# Patient Record
Sex: Female | Born: 1980 | Race: White | Hispanic: No | Marital: Single | Smoking: Former smoker
Health system: Southern US, Community
[De-identification: ages and names within clinical notes are randomized; demographics above are authoritative.]

## PROBLEM LIST (undated history)

## (undated) DIAGNOSIS — F319 Bipolar disorder, unspecified: Secondary | ICD-10-CM

## (undated) DIAGNOSIS — U071 COVID-19: Secondary | ICD-10-CM

## (undated) DIAGNOSIS — F329 Major depressive disorder, single episode, unspecified: Secondary | ICD-10-CM

## (undated) DIAGNOSIS — K5090 Crohn's disease, unspecified, without complications: Secondary | ICD-10-CM

## (undated) DIAGNOSIS — F32A Depression, unspecified: Secondary | ICD-10-CM

## (undated) DIAGNOSIS — F341 Dysthymic disorder: Secondary | ICD-10-CM

## (undated) DIAGNOSIS — F909 Attention-deficit hyperactivity disorder, unspecified type: Secondary | ICD-10-CM

## (undated) DIAGNOSIS — K589 Irritable bowel syndrome without diarrhea: Secondary | ICD-10-CM

## (undated) DIAGNOSIS — M26609 Unspecified temporomandibular joint disorder, unspecified side: Secondary | ICD-10-CM

## (undated) DIAGNOSIS — C4490 Unspecified malignant neoplasm of skin, unspecified: Secondary | ICD-10-CM

## (undated) DIAGNOSIS — R1310 Dysphagia, unspecified: Secondary | ICD-10-CM

## (undated) HISTORY — DX: Unspecified malignant neoplasm of skin, unspecified: C44.90

## (undated) HISTORY — DX: Depression, unspecified: F32.A

## (undated) HISTORY — DX: Crohn's disease, unspecified, without complications: K50.90

## (undated) HISTORY — DX: COVID-19: U07.1

## (undated) HISTORY — PX: APPENDECTOMY: SHX54

## (undated) HISTORY — DX: Dysthymic disorder: F34.1

## (undated) HISTORY — DX: Dysphagia, unspecified: R13.10

## (undated) HISTORY — DX: Bipolar disorder, unspecified: F31.9

## (undated) HISTORY — DX: Unspecified temporomandibular joint disorder, unspecified side: M26.609

## (undated) HISTORY — DX: Attention-deficit hyperactivity disorder, unspecified type: F90.9

## (undated) HISTORY — DX: Irritable bowel syndrome, unspecified: K58.9

## (undated) HISTORY — DX: Major depressive disorder, single episode, unspecified: F32.9

---

## 2004-05-20 HISTORY — PX: COLON SURGERY: SHX602

## 2008-05-14 ENCOUNTER — Emergency Department (HOSPITAL_COMMUNITY): Admission: EM | Admit: 2008-05-14 | Discharge: 2008-05-14 | Payer: Self-pay | Admitting: Emergency Medicine

## 2011-07-18 LAB — CBC
HCT: 41.2
MCV: 86.1
RBC: 4.78
WBC: 8.2

## 2011-07-18 LAB — COMPREHENSIVE METABOLIC PANEL
AST: 18
CO2: 25
Chloride: 108
Creatinine, Ser: 0.74
GFR calc Af Amer: >60
GFR calc non Af Amer: >60
Total Bilirubin: 0.6

## 2011-07-18 LAB — DIFFERENTIAL
Basophils Absolute: 0
Basophils Relative: 0
Eosinophils Absolute: 0.1
Eosinophils Relative: 1
Lymphocytes Relative: 32

## 2011-07-18 LAB — URINALYSIS, ROUTINE W REFLEX MICROSCOPIC
Ketones, ur: NEGATIVE
Nitrite: NEGATIVE
Protein, ur: NEGATIVE
Urobilinogen, UA: 0.2
pH: 6

## 2011-07-18 LAB — POCT PREGNANCY, URINE: Operator id: 29452

## 2012-10-20 HISTORY — PX: CHOLECYSTECTOMY: SHX55

## 2013-10-03 HISTORY — PX: ESOPHAGOGASTRODUODENOSCOPY: SHX1529

## 2016-07-27 DIAGNOSIS — R002 Palpitations: Secondary | ICD-10-CM

## 2016-07-27 HISTORY — DX: Palpitations: R00.2

## 2017-09-02 HISTORY — PX: COLONOSCOPY: SHX174

## 2019-05-24 ENCOUNTER — Telehealth: Payer: Self-pay

## 2019-05-24 NOTE — Telephone Encounter (Signed)
Called patient and lvm to return call in regards to her appointment and if she needs to be screened

## 2019-05-25 ENCOUNTER — Other Ambulatory Visit: Payer: Self-pay

## 2019-05-25 ENCOUNTER — Encounter: Payer: Self-pay | Admitting: Gastroenterology

## 2019-05-25 ENCOUNTER — Telehealth: Payer: Medicaid Other | Admitting: Gastroenterology

## 2019-05-25 ENCOUNTER — Telehealth (INDEPENDENT_AMBULATORY_CARE_PROVIDER_SITE_OTHER): Payer: Medicaid Other | Admitting: Gastroenterology

## 2019-05-25 VITALS — Ht 62.0 in | Wt 140.0 lb

## 2019-05-25 DIAGNOSIS — K5000 Crohn's disease of small intestine without complications: Secondary | ICD-10-CM

## 2019-05-25 NOTE — Progress Notes (Signed)
Chief Complaint:   Referring Provider:  Alanson Puls, [facility] *      ASSESSMENT AND PLAN;   #1.  Crohn's disease. Dx 2005 at age 38 s/p R ileocolectomy (2005), with postoperative recurrance, moderate activity on colon 07/2015. Refuses any meds for Crohn's d/t S/Es.  Self controlled on CBD oil.  Has associated IBS with diarrhea and element of postcholecystectomy diarrhea.   #2.  Associated B12 deficiency.  Plan: - Proceed with colonoscopy with MiraLAX. Discussed risks & benefits. (Risks including rare perforation req laparotomy, bleeding after biopsies/polypectomy req blood transfusion, rare chance of missing neoplasms, risks of anesthesia/sedation). Benefits outweigh the risks. Patient agrees to proceed. All the questions were answered.  - Please obtain previous records.  Had blood work performed this morning and previously. - B12 1 mg IM every month x 12 months. - Prior to colonoscopy, check stool for GI pathogens, WBCs and Giardia antigen (has well water).     HPI:    Monica Barker is a 38 y.o. female  For follow-up visit Recently had increased diarrhea from baseline (2-3/day) to 4/day.  Somewhat better now.  This is when she ran out of CBD oil.  Had few aphthous ulcers in the mouth which have resolved.  Also had some rectal bleeding which was attributed to "hemorrhoids".  These are better as well.  She has been trying to lose weight and has been able to lose 20 pounds.  Currently weighs 137 pounds.  No nausea, vomiting, significant abdominal pain, fever or chills.  Had skin rash which is resolved.  Refused all medications for Crohn's disease including steroids. As most of the medicines make her "crazy".   Past Medical History:  Diagnosis Date  . ADHD (attention deficit hyperactivity disorder)   . Bipolar disorder ([place])   . Crohn disease ([place])    Dx 2005 at age 22 s/p R hemicolectomy, with postoperative recurrance, moderate activity on colonoscopy 08/08/2015  .  Depressive disorder   . Dysphagia   . Dysthymic disorder   . IBS (irritable bowel syndrome)   . IBS (irritable bowel syndrome)    with diarrhea  . Skin cancer   . TMJ (temporomandibular joint disorder)     Past Surgical History:  Procedure Laterality Date  . APPENDECTOMY    . CHOLECYSTECTOMY  2014  . COLON SURGERY  05/2004   due to Crohns  . COLON SURGERY  05/2004  . COLONOSCOPY  09/02/2017   Recurrance of Crohn's disease in the neo terminal ileum (biopsied)- mild. Minimal rectal erythema (? importance-biopsed). No evidence of peranal Crohn's disease. Status post ileocolectomy.   . COLONOSCOPY  09/02/2017   Recurrance of Crohn's Disease in the neo terminal ileum (biopsed)-mild. Minimal rectal erythema (? Importance-biposed) No Evidence of peranal crohn's disease. Status post ileccolectomy.  . ESOPHAGOGASTRODUODENOSCOPY  10/03/2013   Mild gastritis. Normal EGD  . ESOPHAGOGASTRODUODENOSCOPY  10/03/2013   Mild Gastritis. Normal EGD    Family History  Problem Relation Age of Onset  . Skin cancer Father   . Colon cancer Paternal Aunt        Paternal mom's sister/PGM's sister  . Skin cancer Paternal Grandmother   . Breast cancer Maternal Grandmother        happened twice/x2  . Esophageal cancer Neg Hx     Social History   Tobacco Use  . Smoking status: Current Some Day Smoker  . Smokeless tobacco: Never Used  . Tobacco comment: Trying to quit  Substance Use Topics  . Alcohol  use: Not Currently  . Drug use: Not Currently    Current Outpatient Medications  Medication Sig Dispense Refill  . ALPRAZolam (XANAX XR) 0.5 MG 24 hr tablet Take 0.5 mg by mouth daily.    . AMBULATORY NON FORMULARY MEDICATION daily. CBD Oil    . Cyanocobalamin (VITAMIN B12 PO) Take 1 tablet by mouth daily.    Marland Kitchen lisdexamfetamine (VYVANSE) 30 MG capsule Take 30 mg by mouth daily.    . Multiple Vitamin (MULTIVITAMIN) capsule Take 1 capsule by mouth daily.    . Probiotic Product (PROBIOTIC PO) Take 1  tablet by mouth daily.    Marland Kitchen ALPRAZolam (XANAX) 0.5 MG tablet Take 0.5 mg by mouth 3 (three) times daily as needed for anxiety.    . AMBULATORY NON FORMULARY MEDICATION daily. CBD oil. Will see about the dosage she takes    . Cyanocobalamin (VITAMIN B-12 PO) Take 1 tablet by mouth daily.    Marland Kitchen lisdexamfetamine (VYVANSE) 30 MG capsule Take 30 mg by mouth daily.    . Multiple Vitamin (MULTIVITAMIN) tablet Take 1 tablet by mouth daily.    . Probiotic Product (PROBIOTIC PO) Take 1 tablet by mouth daily.     No current facility-administered medications for this visit.     Allergies  Allergen Reactions  . Latex   . Latex Other (See Comments)    unknown    Review of Systems:  Constitutional: Denies fever, chills, diaphoresis, appetite change and fatigue.  HEENT: Denies photophobia, eye pain, redness, hearing loss, ear pain, congestion, sore throat, rhinorrhea, sneezing, mouth sores, neck pain, neck stiffness and tinnitus.   Respiratory: Denies SOB, DOE, cough, chest tightness,  and wheezing.   Cardiovascular: Denies chest pain, palpitations and leg swelling.  Genitourinary: Denies dysuria, urgency, frequency, hematuria, flank pain and difficulty urinating.  Musculoskeletal: Denies myalgias, back pain, joint swelling, arthralgias and gait problem.  Skin: No rash.  Neurological: Denies dizziness, seizures, syncope, weakness, light-headedness, numbness and headaches.  Hematological: Denies adenopathy. Easy bruising, personal or family bleeding history  Psychiatric/Behavioral: has anxiety or depression     Physical Exam:    Ht 5' 2"  (1.575 m)   Wt 140 lb (63.5 kg)   BMI 25.61 kg/m  Filed Weights   05/25/19 1114  Weight: 140 lb (63.5 kg)   Constitutional:  Well-developed, in no acute distress. Psychiatric: Normal mood and affect. Behavior is normal. HEENT: Pupils normal.  Conjunctivae are normal. No scleral icterus. tele  Data Reviewed: I have personally reviewed following labs and  imaging studies  CBC: CBC 05/14/2008  WBC 8.2  Hemoglobin 13.9  Hematocrit 41.2  Platelets 254    CMP: CMP 05/14/2008  Glucose 103(H)  BUN 6  Creatinine 0.74  Sodium 137  Potassium 3.8  Chloride 108  CO2 25  Calcium 9.1  Total Protein 6.6  Total Bilirubin 0.6  Alkaline Phos 45  AST 18  ALT 15    This service was provided via video doxy telemedicine.  The patient was located at home.  The provider was located in office.  The patient did consent to this telephone visit and is aware of possible charges through their insurance for this visit.  The patient was referred by self.   Time spent on call/coordination of care: 25 min    Carmell Austria, MD 05/25/2019, 6:09 PM  Cc: Alanson Puls, [facility] *

## 2019-05-26 MED ORDER — CYANOCOBALAMIN 1000 MCG/ML IJ SOLN: 1000.0000 ug | INTRAMUSCULAR | 12 refills | Status: DC

## 2019-05-26 NOTE — Patient Instructions (Addendum)
If you are age 36 or older, your body mass index should be between 23-30. Your Body mass index is 25.61 kg/m. If this is out of the aforementioned range listed, please consider follow up with your Primary Care Provider.  If you are age 26 or younger, your body mass index should be between 19-25. Your Body mass index is 25.61 kg/m. If this is out of the aformentioned range listed, please consider follow up with your Primary Care Provider.   We have sent the following medications to your pharmacy for you to pick up at your convenience: Vitamin B12   You have been scheduled for a colonoscopy. Please follow written instructions given to you at your visit today.  Please pick up your prep supplies at the pharmacy within the next 1-3 days. If you use inhalers (even only as needed), please bring them with you on the day of your procedure. Your physician has requested that you go to www.[email or web address].com and enter the access code given to you at your visit today. This web site gives a general overview about your procedure. However, you should still follow specific instructions given to you by our office regarding your preparation for the procedure.  Please go to the lab at [facility] ([place] You will need to go to level "B", you do not need an appointment for this. Hours available are 7:30 am - 4:30 pm.   I have attached a Pre Procedure Patient Acknowledgement form and a prepaid envelope, please initial and sign form and mail back in envelope.    Thank you,  Dr. Jackquline Denmark

## 2019-05-26 NOTE — Addendum Note (Signed)
Addended by: Karena Addison on: 05/26/2019 02:21 PM   Modules accepted: Orders

## 2019-06-13 ENCOUNTER — Telehealth: Payer: Self-pay | Admitting: Gastroenterology

## 2019-06-13 ENCOUNTER — Telehealth: Payer: Self-pay

## 2019-06-13 NOTE — Telephone Encounter (Signed)
Covid-19 screening questions   Do you now or have you had a fever in the last 14 days? NO   Do you have any respiratory symptoms of shortness of breath or cough now or in the last 14 days? NO   Do you have any family members or close contacts with diagnosed or suspected Covid-19 in the past 14 days? NO   Have you been tested for Covid-19 and found to be positive? NO

## 2019-06-13 NOTE — Telephone Encounter (Signed)
Noted  

## 2019-06-13 NOTE — Telephone Encounter (Signed)
Returned patients call. No answer. A message was left that I would call her later today.   Riki Sheer, LPN ( [facility] )

## 2019-06-14 ENCOUNTER — Encounter: Payer: Medicaid Other | Admitting: Gastroenterology

## 2019-07-15 ENCOUNTER — Encounter: Payer: Medicaid Other | Admitting: Gastroenterology

## 2019-08-11 ENCOUNTER — Telehealth: Payer: Self-pay

## 2019-08-11 NOTE — Telephone Encounter (Signed)
Covid-19 screening questions   Do you now or have you had a fever in the last 14 days?  Do you have any respiratory symptoms of shortness of breath or cough now or in the last 14 days?  Do you have any family members or close contacts with diagnosed or suspected Covid-19 in the past 14 days?  Have you been tested for Covid-19 and found to be positive?       

## 2019-08-12 ENCOUNTER — Encounter: Payer: Medicaid Other | Admitting: Gastroenterology

## 2019-12-08 ENCOUNTER — Telehealth: Payer: Medicaid Other | Admitting: Gastroenterology

## 2019-12-22 ENCOUNTER — Other Ambulatory Visit: Payer: Self-pay

## 2019-12-22 ENCOUNTER — Encounter: Payer: Self-pay | Admitting: Gastroenterology

## 2019-12-22 ENCOUNTER — Telehealth (INDEPENDENT_AMBULATORY_CARE_PROVIDER_SITE_OTHER): Payer: Medicaid Other | Admitting: Gastroenterology

## 2019-12-22 VITALS — Ht 62.0 in | Wt 134.0 lb

## 2019-12-22 DIAGNOSIS — K625 Hemorrhage of anus and rectum: Secondary | ICD-10-CM

## 2019-12-22 DIAGNOSIS — E538 Deficiency of other specified B group vitamins: Secondary | ICD-10-CM | POA: Diagnosis not present

## 2019-12-22 DIAGNOSIS — K50919 Crohn's disease, unspecified, with unspecified complications: Secondary | ICD-10-CM

## 2019-12-22 DIAGNOSIS — Z8616 Personal history of COVID-19: Secondary | ICD-10-CM

## 2019-12-22 MED ORDER — PREDNISONE 10 MG PO TABS: ORAL_TABLET | ORAL | 0 refills | Status: DC

## 2019-12-22 NOTE — Progress Notes (Signed)
Chief Complaint:   Referring Provider:  Alanson Puls, [facility] *      ASSESSMENT AND PLAN;   #1.  Crohn's disease with exacerbation likely due to recent COVID-66.   Dx 2005 at age 39 s/p R ileocolectomy (2005), with postoperative recurrance, moderate activity on colon 07/2015. Refused any meds for Crohn's d/t S/Es.  Self controlled on CBD oil.  Has associated IBS with diarrhea and element of postcholecystectomy diarrhea.   #2.  Associated B12 deficiency.  Plan: - Stool for GI pathogen, C. Diff, fecal calprotectin and giardia antigen. - Check CBC, CMP, CRP, B12, TB Gold and HBsAg, anti HBsAb, HBcAb IgG. - CT A/P with p.o. and IV contrast ASAP. - Start prednisone 40 mg p.o. QD x 1 week, 30 mg p.o. Qd x 1 week, then, 20 mg qd x 2 weeks, then 10 mg QD x 2 weeks and then stop. D/w pt S/Es of prednisone including weight gain, diabetes, osteoporosis, cataracts, glaucoma and psychiatric manifestations. - Recommend colonoscopy with MiraLAX in 6-8 weeks. - Avoid NSAIDs. - Hold off on work. Needs note off work. Can e mail note [email or web address] - FU next week (can do tele) - Discussed regarding Humira.     HPI:    Monica Barker is a 39 y.o. female  Had covid Feb 2- whole family.  Unfortunately, GM passed away  Thereafter, she started having increasing lower abdominal pain, diarrhea with watery bowel movements 5-10/day, most recently started having blood mixed with the stool.  No fever but having chills.  Able to eat and drink  Lost 2-3lb since February.  She was previously scheduled for colonoscopy but she canceled.  Refused all medications for Crohn's disease in the past. As most of the medicines make her "crazy".   Past Medical History:  Diagnosis Date  . ADHD (attention deficit hyperactivity disorder)   . Bipolar disorder ([place])   . Crohn disease ([place])    Dx 2005 at age 37 s/p R hemicolectomy, with postoperative recurrance, moderate activity on colonoscopy 08/08/2015    . Depressive disorder   . Dysphagia   . Dysthymic disorder   . IBS (irritable bowel syndrome)   . IBS (irritable bowel syndrome)    with diarrhea  . Skin cancer   . TMJ (temporomandibular joint disorder)     Past Surgical History:  Procedure Laterality Date  . APPENDECTOMY    . CHOLECYSTECTOMY  2014  . COLON SURGERY  05/2004   due to Crohns  . COLON SURGERY  05/2004  . COLONOSCOPY  09/02/2017   Recurrance of Crohn's disease in the neo terminal ileum (biopsied)- mild. Minimal rectal erythema (? importance-biopsed). No evidence of peranal Crohn's disease. Status post ileocolectomy.   . COLONOSCOPY  09/02/2017   Recurrance of Crohn's Disease in the neo terminal ileum (biopsed)-mild. Minimal rectal erythema (? Importance-biposed) No Evidence of peranal crohn's disease. Status post ileccolectomy.  . ESOPHAGOGASTRODUODENOSCOPY  10/03/2013   Mild gastritis. Normal EGD  . ESOPHAGOGASTRODUODENOSCOPY  10/03/2013   Mild Gastritis. Normal EGD    Family History  Problem Relation Age of Onset  . Skin cancer Father   . Colon cancer Paternal Aunt        Paternal mom's sister/PGM's sister  . Skin cancer Paternal Grandmother   . Breast cancer Maternal Grandmother        happened twice/x2  . Esophageal cancer Neg Hx     Social History   Tobacco Use  . Smoking status: Former Research scientist (life sciences)  . Smokeless tobacco: Never  Used  . Tobacco comment: 09/2019  Substance Use Topics  . Alcohol use: Not Currently  . Drug use: Not Currently    Current Outpatient Medications  Medication Sig Dispense Refill  . ALPRAZolam (XANAX) 0.5 MG tablet Take 0.5 mg by mouth 3 (three) times daily as needed for anxiety.    . AMBULATORY NON FORMULARY MEDICATION daily. CBD Oil    . cyanocobalamin (,VITAMIN B-12,) 1000 MCG/ML injection Inject 1 mL (1,000 mcg total) into the muscle every 30 (thirty) days. 1 mL 12  . lisdexamfetamine (VYVANSE) 30 MG capsule Take 30 mg by mouth daily.    . Multiple Vitamin (MULTIVITAMIN)  capsule Take 1 capsule by mouth daily.    . Probiotic Product (PROBIOTIC PO) Take 1 tablet by mouth daily.     No current facility-administered medications for this visit.    Allergies  Allergen Reactions  . Ativan [Lorazepam]     intesitided her anxiety   . Latex   . Latex Other (See Comments)    unknown    Review of Systems:  neg     Physical Exam:    Ht 5' 2"  (1.575 m)   Wt 134 lb (60.8 kg)   BMI 24.51 kg/m  Filed Weights   12/22/19 1317  Weight: 134 lb (60.8 kg)   tele  Data Reviewed: I have personally reviewed following labs and imaging studies  CBC: CBC 05/14/2008  WBC 8.2  Hemoglobin 13.9  Hematocrit 41.2  Platelets 254    CMP: CMP 05/14/2008  Glucose 103(H)  BUN 6  Creatinine 0.74  Sodium 137  Potassium 3.8  Chloride 108  CO2 25  Calcium 9.1  Total Protein 6.6  Total Bilirubin 0.6  Alkaline Phos 45  AST 18  ALT 15    This service was provided via video doxy telemedicine.  The patient was located at home.  The provider was located in office.  The patient did consent to this telephone visit and is aware of possible charges through their insurance for this visit.  The patient was referred by self.   Time spent on call/coordination of care: 25 min    Carmell Austria, MD 12/22/2019, 4:02 PM  Cc: Alanson Puls, [facility] *

## 2019-12-22 NOTE — Patient Instructions (Signed)
If you are age 37 or older, your body mass index should be between 23-30. Your Body mass index is 24.51 kg/m. If this is out of the aforementioned range listed, please consider follow up with your Primary Care Provider.  If you are age 66 or younger, your body mass index should be between 19-25. Your Body mass index is 24.51 kg/m. If this is out of the aformentioned range listed, please consider follow up with your Primary Care Provider.   Please go to the lab at [facility] ([place] You will need to go to level "B", you do not need an appointment for this. Hours available are 7:30 am - 4:30 pm.   [facility] [address] [facility]   You are scheduled on 12/27/19 at [employer] should arrive 15 minutes prior to your appointment time for registration. Please follow the written instructions below on the day of your exam:  WARNING: IF YOU ARE ALLERGIC TO IODINE/X-RAY DYE, PLEASE NOTIFY RADIOLOGY IMMEDIATELY AT [phone]! YOU WILL BE GIVEN A 13 HOUR PREMEDICATION PREP.  1) Do not eat or drink anything after 5am (4 hours prior to your test) 2) You have been given 2 bottles of oral contrast to drink. The solution may taste better if refrigerated, but do NOT add ice or any other liquid to this solution. Shake well before drinking.    Drink 1 bottle of contrast @ 7am (2 hours prior to your exam)  Drink 1 bottle of contrast @ 8am (1 hour prior to your exam)  You may take any medications as prescribed with a small amount of water, if necessary. If you take any of the following medications: METFORMIN, GLUCOPHAGE, GLUCOVANCE, AVANDAMET, RIOMET, FORTAMET, [place] MET, JANUMET, GLUMETZA or METAGLIP, you MAY be asked to HOLD this medication 48 hours AFTER the exam.  The purpose of you drinking the oral contrast is to aid in the visualization of your intestinal  tract. The contrast solution may cause some diarrhea. Depending on your individual set of symptoms, you may also receive an intravenous injection of x-ray contrast/dye. Plan on being at [facility] for 30 minutes or longer, depending on the type of exam you are having performed.  This test typically takes 30-45 minutes to complete.  If you have any questions regarding your exam or if you need to reschedule, you may call the CT department at [phone] between the hours of 8:00 am and 5:00 pm, Monday-Friday.  ________________________________________________________________________  Avoid NSAIDS.   We have sent the following medications to your pharmacy for you to pick up at your convenience: Prednisone   Follow up in 6-8 weeks.   Thank you,  Dr. Jackquline Denmark

## 2019-12-23 ENCOUNTER — Telehealth: Payer: Self-pay | Admitting: Gastroenterology

## 2019-12-23 NOTE — Telephone Encounter (Signed)
Note has been emailed to the patient at the address listed in previous message;

## 2019-12-23 NOTE — Telephone Encounter (Signed)
Off work x 2 weeks while Crohn's work-up is in progress Please email her the note at [email or web address]  Thx  RG

## 2019-12-23 NOTE — Telephone Encounter (Signed)
Please review patient message and advise

## 2019-12-27 ENCOUNTER — Other Ambulatory Visit (INDEPENDENT_AMBULATORY_CARE_PROVIDER_SITE_OTHER): Payer: BC Managed Care – PPO

## 2019-12-27 ENCOUNTER — Ambulatory Visit (HOSPITAL_BASED_OUTPATIENT_CLINIC_OR_DEPARTMENT_OTHER): Payer: BC Managed Care – PPO

## 2019-12-27 DIAGNOSIS — K50919 Crohn's disease, unspecified, with unspecified complications: Secondary | ICD-10-CM

## 2019-12-27 DIAGNOSIS — K625 Hemorrhage of anus and rectum: Secondary | ICD-10-CM

## 2019-12-27 LAB — COMPREHENSIVE METABOLIC PANEL
ALT: 10 U/L (ref 0–35)
AST: 13 U/L (ref 0–37)
Albumin: 4.2 g/dL (ref 3.5–5.2)
Alkaline Phosphatase: 45 U/L (ref 39–117)
BUN: 6 mg/dL (ref 6–23)
CO2: 26 mEq/L (ref 19–32)
Calcium: 9.4 mg/dL (ref 8.4–10.5)
Chloride: 106 mEq/L (ref 96–112)
Creatinine, Ser: 0.73 mg/dL (ref 0.40–1.20)
GFR: 89.9 mL/min (ref >60.00)
Glucose, Bld: 84 mg/dL (ref 70–99)
Potassium: 3.9 mEq/L (ref 3.5–5.1)
Sodium: 137 mEq/L (ref 135–145)
Total Bilirubin: 0.5 mg/dL (ref 0.2–1.2)
Total Protein: 7.3 g/dL (ref 6.0–8.3)

## 2019-12-27 LAB — CBC WITH DIFFERENTIAL/PLATELET
Basophils Absolute: 0 10*3/uL (ref 0.0–0.1)
Basophils Relative: 0.5 % (ref 0.0–3.0)
Eosinophils Absolute: 0 10*3/uL (ref 0.0–0.7)
Eosinophils Relative: 0.3 % (ref 0.0–5.0)
HCT: 40.2 % (ref 36.0–46.0)
Hemoglobin: 13.4 g/dL (ref 12.0–15.0)
Lymphocytes Relative: 26.4 % (ref 12.0–46.0)
Lymphs Abs: 1.6 10*3/uL (ref 0.7–4.0)
MCHC: 33.3 g/dL (ref 30.0–36.0)
MCV: 89 fl (ref 78.0–100.0)
Monocytes Absolute: 0.3 10*3/uL (ref 0.1–1.0)
Monocytes Relative: 5.5 % (ref 3.0–12.0)
Neutro Abs: 4.1 10*3/uL (ref 1.4–7.7)
Neutrophils Relative %: 67.3 % (ref 43.0–77.0)
Platelets: 272 10*3/uL (ref 150.0–400.0)
RBC: 4.52 Mil/uL (ref 3.87–5.11)
RDW: 12.8 % (ref 11.5–15.5)
WBC: 6.1 10*3/uL (ref 4.0–10.5)

## 2019-12-27 LAB — C-REACTIVE PROTEIN: CRP: <1 mg/dL (ref 0.5–20.0)

## 2019-12-27 LAB — VITAMIN B12: Vitamin B-12: 416 pg/mL (ref 211–911)

## 2019-12-28 ENCOUNTER — Encounter (HOSPITAL_BASED_OUTPATIENT_CLINIC_OR_DEPARTMENT_OTHER): Payer: Self-pay

## 2019-12-28 ENCOUNTER — Other Ambulatory Visit: Payer: Self-pay

## 2019-12-28 ENCOUNTER — Ambulatory Visit (HOSPITAL_BASED_OUTPATIENT_CLINIC_OR_DEPARTMENT_OTHER)
Admission: RE | Admit: 2019-12-28 | Discharge: 2019-12-28 | Disposition: A | Discharge status: Home / Self Care | Payer: BC Managed Care – PPO | Source: Ambulatory Visit | Attending: Gastroenterology | Admitting: Gastroenterology

## 2019-12-28 ENCOUNTER — Encounter: Payer: Self-pay | Admitting: Gastroenterology

## 2019-12-28 ENCOUNTER — Telehealth (INDEPENDENT_AMBULATORY_CARE_PROVIDER_SITE_OTHER): Payer: BC Managed Care – PPO | Admitting: Gastroenterology

## 2019-12-28 VITALS — Ht 62.0 in | Wt 136.0 lb

## 2019-12-28 DIAGNOSIS — K50919 Crohn's disease, unspecified, with unspecified complications: Secondary | ICD-10-CM | POA: Diagnosis not present

## 2019-12-28 DIAGNOSIS — K625 Hemorrhage of anus and rectum: Secondary | ICD-10-CM | POA: Diagnosis present

## 2019-12-28 MED ORDER — IOHEXOL 300 MG/ML  SOLN
100.0000 mL | Freq: Once | INTRAMUSCULAR | Status: AC | PRN
  Administered 2019-12-28: 100 mL via INTRAVENOUS

## 2019-12-28 NOTE — Addendum Note (Signed)
Addended by: Karena Addison on: 12/28/2019 09:45 AM   Modules accepted: Orders

## 2019-12-28 NOTE — Patient Instructions (Signed)
If you are age 37 or older, your body mass index should be between 23-30. Your Body mass index is 24.87 kg/m. If this is out of the aforementioned range listed, please consider follow up with your Primary Care Provider.  If you are age 27 or younger, your body mass index should be between 19-25. Your Body mass index is 24.87 kg/m. If this is out of the aformentioned range listed, please consider follow up with your Primary Care Provider.   You have been scheduled for a colonoscopy. Please follow written instructions given to you at your visit today.  Please pick up your prep supplies at the pharmacy within the next 1-3 days. If you use inhalers (even only as needed), please bring them with you on the day of your procedure. Your physician has requested that you go to www.[email or web address] and enter the access code given to you at your visit today. This web site gives a general overview about your procedure. However, you should still follow specific instructions given to you by our office regarding your preparation for the procedure.  Thank you,  Dr. Jackquline Denmark

## 2019-12-28 NOTE — Progress Notes (Signed)
Chief Complaint: FU  Referring Provider:  Alanson Puls, [facility] *      ASSESSMENT AND PLAN;   #1.  Crohn's disease with exacerbation likely due to recent COVID-26.   Dx 2005 at age 39 s/p R ileocolectomy (2005), with postoperative recurrance, moderate activity on colon 07/2015. Refused any meds for Crohn's d/t S/Es.  Self controlled on CBD oil.  Has associated IBS with diarrhea and element of postcholecystectomy diarrhea.   #2.  Associated B12 deficiency. Last B12 was normal.  Plan: - Stool for GI pathogen, C. Diff, fecal calprotectin and giardia antigen. - Follow results of TB Gold and HBsAg, anti HBsAb, HBcAb IgG (pending) - CT A/P with p.o. and IV contrast ASAP.  She is on her way to get it done. - She has reduced prednisone to 30 mg p.o. Qd x 1 week, then, 20 mg qd x 2 weeks, then 10 mg QD x 2 weeks and then stop. D/w pt S/Es of prednisone including weight gain, diabetes, osteoporosis, cataracts, glaucoma and psychiatric manifestations. - Recommend colonoscopy with MiraLAX in 2-3 weeks at [facility]. - Avoid NSAIDs. - Discussed regarding Humira again.     HPI:    Monica Barker is a 39 y.o. female  Had covid Feb 2- whole family.  Unfortunately, GM passed away  Thereafter, she started having increasing lower abdominal pain, diarrhea with watery bowel movements 5-10/day, most recently started having blood mixed with the stool.  No fever but having chills.  Able to eat and drink  Lost 2-3lb since February.  She was previously scheduled for colonoscopy but she canceled.  Refused all medications for Crohn's disease in the past. As most of the medicines make her "crazy".   Started on prednisone last week.  She feels significantly better.  Her CBC, CMP, B12, C-reactive protein was normal.  Unfortunately the lab did not give her stool container.  She is on her way to get CT done today.  Would like to get colonoscopy as well.   Past Medical History:  Diagnosis Date  .  ADHD (attention deficit hyperactivity disorder)   . Bipolar disorder ([place])   . Crohn disease ([place])    Dx 2005 at age 88 s/p R hemicolectomy, with postoperative recurrance, moderate activity on colonoscopy 08/08/2015  . Depressive disorder   . Dysphagia   . Dysthymic disorder   . IBS (irritable bowel syndrome)   . IBS (irritable bowel syndrome)    with diarrhea  . Skin cancer   . TMJ (temporomandibular joint disorder)     Past Surgical History:  Procedure Laterality Date  . APPENDECTOMY    . CHOLECYSTECTOMY  2014  . COLON SURGERY  05/2004   due to Crohns  . COLON SURGERY  05/2004  . COLONOSCOPY  09/02/2017   Recurrance of Crohn's disease in the neo terminal ileum (biopsied)- mild. Minimal rectal erythema (? importance-biopsed). No evidence of peranal Crohn's disease. Status post ileocolectomy.   . COLONOSCOPY  09/02/2017   Recurrance of Crohn's Disease in the neo terminal ileum (biopsed)-mild. Minimal rectal erythema (? Importance-biposed) No Evidence of peranal crohn's disease. Status post ileccolectomy.  . ESOPHAGOGASTRODUODENOSCOPY  10/03/2013   Mild gastritis. Normal EGD  . ESOPHAGOGASTRODUODENOSCOPY  10/03/2013   Mild Gastritis. Normal EGD    Family History  Problem Relation Age of Onset  . Skin cancer Father   . Colon cancer Paternal Aunt        Paternal mom's sister/PGM's sister  . Skin cancer Paternal Grandmother   .  Breast cancer Maternal Grandmother        happened twice/x2  . Esophageal cancer Neg Hx     Social History   Tobacco Use  . Smoking status: Former Research scientist (life sciences)  . Smokeless tobacco: Never Used  . Tobacco comment: 09/2019  Substance Use Topics  . Alcohol use: Not Currently  . Drug use: Not Currently    Current Outpatient Medications  Medication Sig Dispense Refill  . ALPRAZolam (XANAX) 0.5 MG tablet Take 0.5 mg by mouth 3 (three) times daily as needed for anxiety.    . AMBULATORY NON FORMULARY MEDICATION daily. CBD Oil    . cyanocobalamin  (,VITAMIN B-12,) 1000 MCG/ML injection Inject 1 mL (1,000 mcg total) into the muscle every 30 (thirty) days. 1 mL 12  . lisdexamfetamine (VYVANSE) 30 MG capsule Take 30 mg by mouth daily.    . Multiple Vitamin (MULTIVITAMIN) capsule Take 1 capsule by mouth daily.    . predniSONE (DELTASONE) 10 MG tablet We have sent the following medications to your pharmacy for you to pick up at your convenience: Prednisone 32m tablet 473mby mouth x 1 week. 3050my mouth x 1 week. 67m24m mouth x 2 weeks. 10mg63mmouth x 2 weeks. 100 tablet 0  . Probiotic Product (PROBIOTIC PO) Take 1 tablet by mouth daily.     No current facility-administered medications for this visit.    Allergies  Allergen Reactions  . Ativan [Lorazepam]     intesitided her anxiety   . Latex   . Latex Other (See Comments)    unknown    Review of Systems:  neg     Physical Exam:    Ht 5' 2"  (1.575 m)   Wt 136 lb (61.7 kg)   BMI 24.87 kg/m  Filed Weights   12/28/19 0836  Weight: 136 lb (61.7 kg)   tele  Data Reviewed: I have personally reviewed following labs and imaging studies  CBC: CBC Latest Ref Rng & Units 12/27/2019 05/14/2008  WBC 4.0 - 10.5 K/uL 6.1 8.2  Hemoglobin 12.0 - 15.0 g/dL 13.4 13.9  Hematocrit 36.0 - 46.0 % 40.2 41.2  Platelets 150.0 - 400.0 K/uL 272.0 254    CMP: CMP Latest Ref Rng & Units 12/27/2019 05/14/2008  Glucose 70 - 99 mg/dL 84 103(H)  BUN 6 - 23 mg/dL 6 6  Creatinine 0.40 - 1.20 mg/dL 0.73 0.74  Sodium 135 - 145 mEq/L 137 137  Potassium 3.5 - 5.1 mEq/L 3.9 3.8  Chloride 96 - 112 mEq/L 106 108  CO2 19 - 32 mEq/L 26 25  Calcium 8.4 - 10.5 mg/dL 9.4 9.1  Total Protein 6.0 - 8.3 g/dL 7.3 6.6  Total Bilirubin 0.2 - 1.2 mg/dL 0.5 0.6  Alkaline Phos 39 - 117 U/L 45 45  AST 0 - 37 U/L 13 18  ALT 0 - 35 U/L 10 15    This service was provided via video doxy telemedicine.  The patient was located at home.  The provider was located in office.  The patient did consent to this telephone  visit and is aware of possible charges through their insurance for this visit.  The patient was referred by self.   Time spent on call/coordination of care: 15 min    Raj GCarmell Austria3/07/2020, 8:55 AM  Cc: Pllc,Alanson [place]uls[place] *

## 2019-12-29 LAB — QUANTIFERON-TB GOLD PLUS
Mitogen-NIL: >10 IU/mL
NIL: 0.07 IU/mL
QuantiFERON-TB Gold Plus: NEGATIVE
TB1-NIL: 0.02 IU/mL
TB2-NIL: 0.05 IU/mL

## 2019-12-29 LAB — HEPATITIS B CORE ANTIBODY, IGM: Hep B C IgM: NONREACTIVE

## 2019-12-29 LAB — HEPATITIS B SURFACE ANTIBODY,QUALITATIVE: Hep B S Ab: NONREACTIVE

## 2019-12-29 LAB — HEPATITIS B SURFACE ANTIGEN: Hepatitis B Surface Ag: NONREACTIVE

## 2020-01-01 NOTE — Progress Notes (Signed)
CT reviewed. Neg TB and HBsAg Start Humira Day 1 148m, day 15 847m then 4091mQ everyother week starting day 29. Needs pneumococcal and hep B vaccine (can be done any time). Colon as per last note. RG

## 2020-01-02 ENCOUNTER — Other Ambulatory Visit: Payer: Self-pay

## 2020-01-02 MED ORDER — HUMIRA-CD/UC/HS STARTER 80 MG/0.8ML ~~LOC~~ AJKT: 80.0000 mg | AUTO-INJECTOR | SUBCUTANEOUS | 0 refills | Status: DC

## 2020-01-02 MED ORDER — HUMIRA (2 PEN) 40 MG/0.4ML ~~LOC~~ AJKT: 40.0000 mg | AUTO-INJECTOR | SUBCUTANEOUS | 11 refills | Status: DC

## 2020-01-19 ENCOUNTER — Encounter: Payer: BC Managed Care – PPO | Admitting: Gastroenterology

## 2020-02-01 ENCOUNTER — Telehealth: Payer: Self-pay | Admitting: Gastroenterology

## 2020-02-01 NOTE — Telephone Encounter (Signed)
[employer] would like [place] to provide pt w/their direct number if she was to call office for any other reason. The number is [phone].

## 2020-07-10 ENCOUNTER — Other Ambulatory Visit: Payer: Self-pay | Admitting: Gastroenterology

## 2021-01-02 ENCOUNTER — Encounter: Payer: Self-pay | Admitting: Gastroenterology

## 2021-02-15 IMAGING — CT CT ABD-PELV W/ CM
2 of 4 series · 16 of 46 positions shown, 18 images · IV contrast (omnipaque)
Comparison: 05/14/2008

CLINICAL DATA: Crohn's disease, rectal bleeding

EXAM:
CT ABDOMEN AND PELVIS WITH CONTRAST
TECHNIQUE: Multidetector CT imaging of the abdomen and pelvis was performed
using the standard protocol following bolus administration of
intravenous contrast.
CONTRAST:  100mL OMNIPAQUE IOHEXOL 300 MG/ML SOLN, additional oral
enteric contrast

[Series 2: axial st · axial · 0.85mm/px · z∈[-441,-61]mm · 13 of 84 slices shown, 15 images]
[im 4/84  soft-tissue]
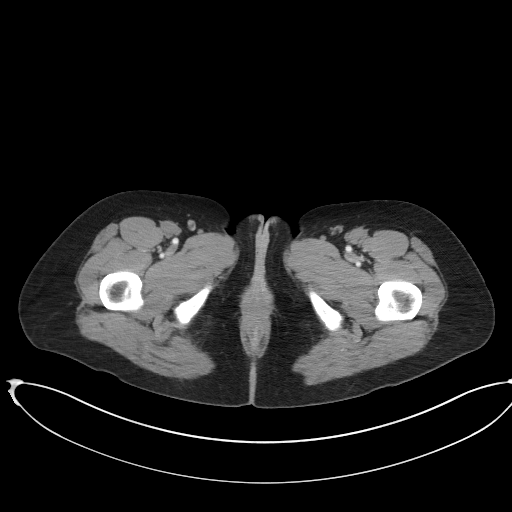
[im 4/84  bone]
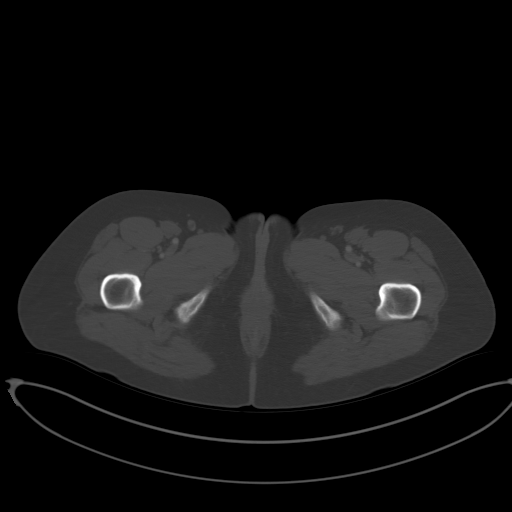
[im 11/84  soft-tissue]
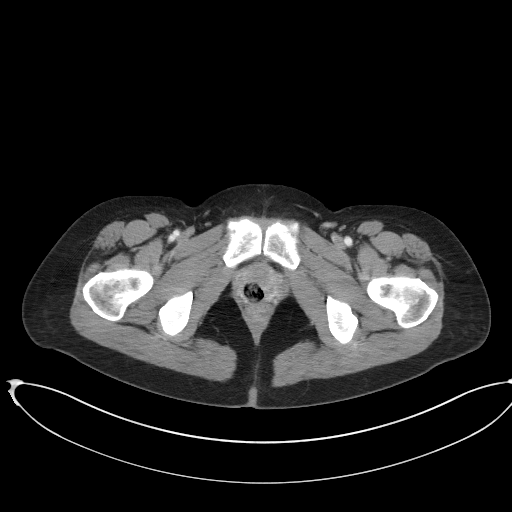
[im 18/84  soft-tissue]
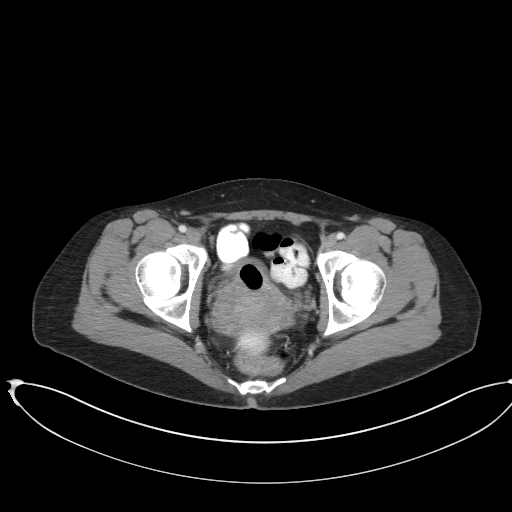
[im 25/84  soft-tissue]
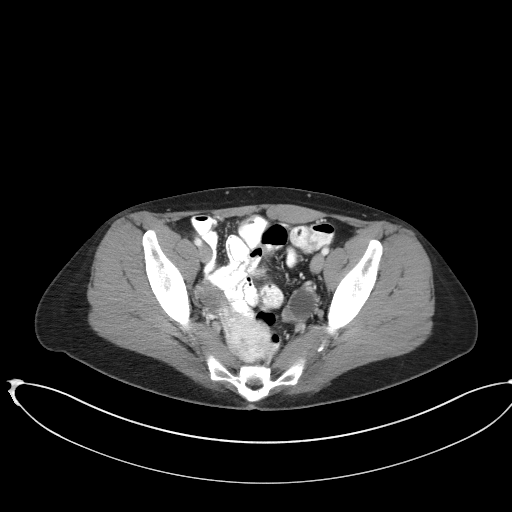
[im 28/84  soft-tissue]
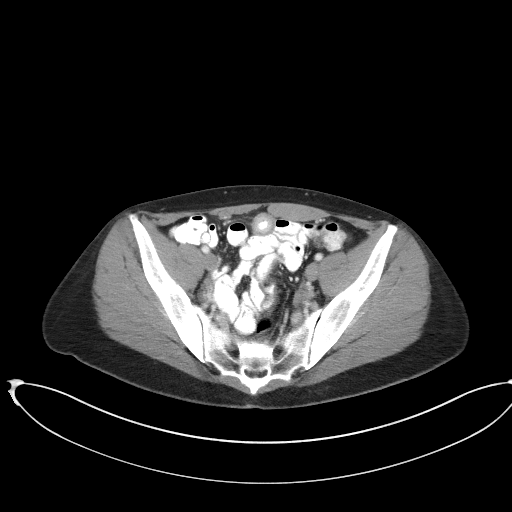
[im 35/84  soft-tissue]
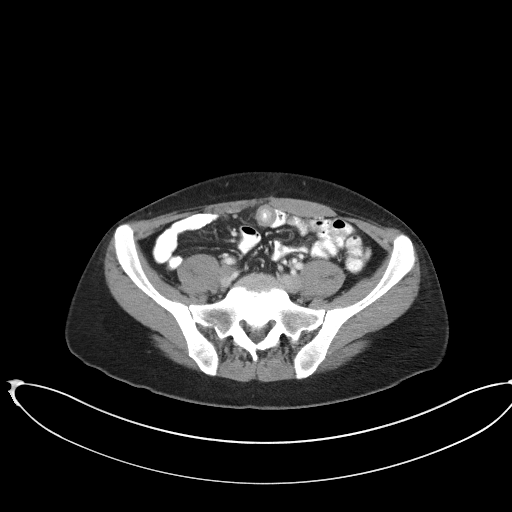
[im 42/84  soft-tissue]
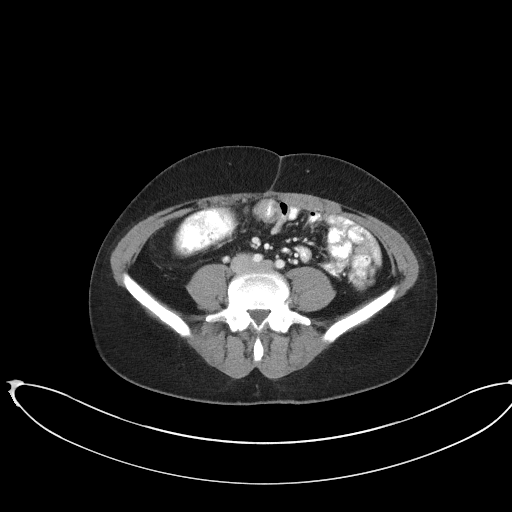
[im 49/84  soft-tissue]
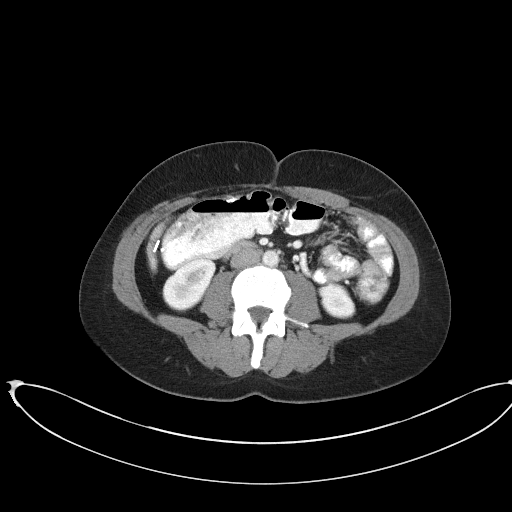
[im 56/84  soft-tissue]
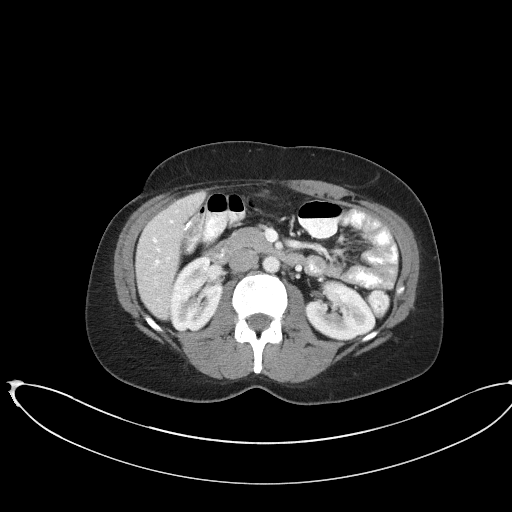
[im 56/84  bone]
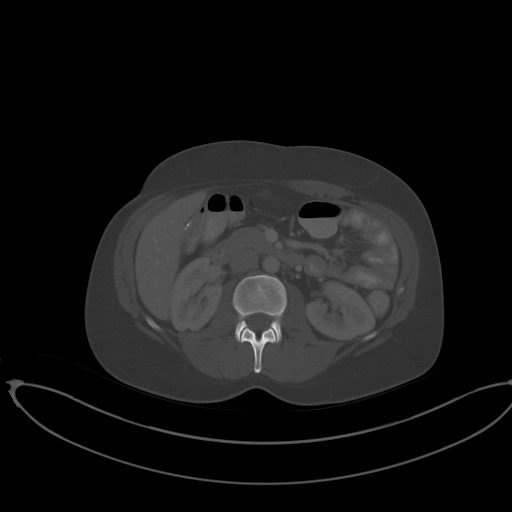
[im 59/84  soft-tissue]
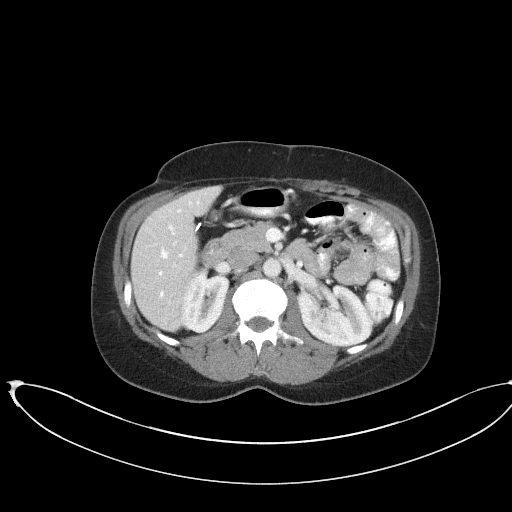
[im 66/84  soft-tissue]
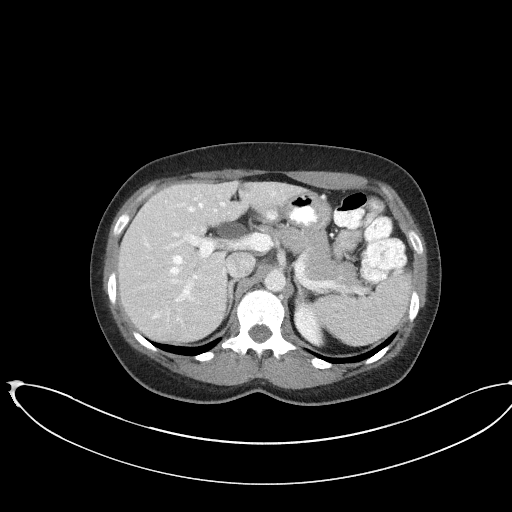
[im 73/84  soft-tissue]
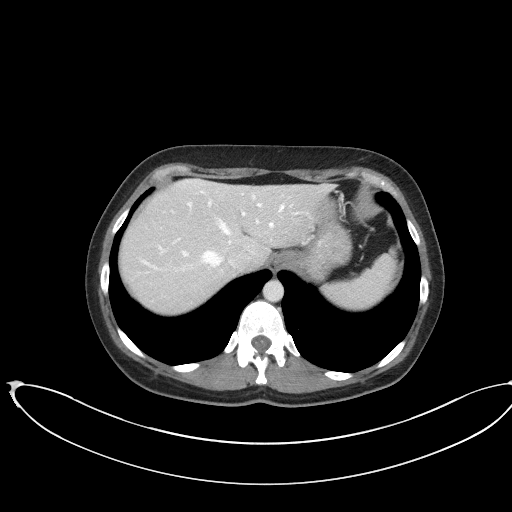
[im 80/84  soft-tissue]
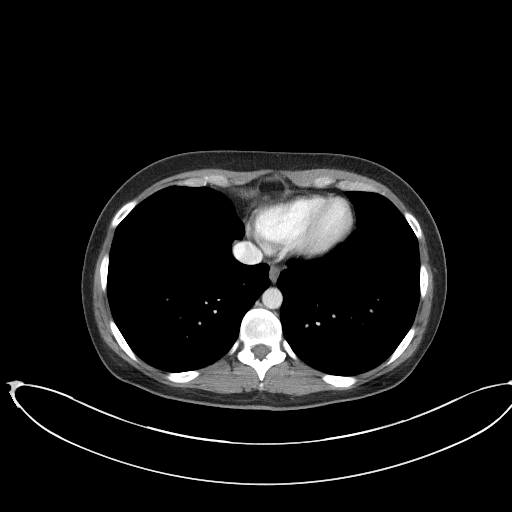

[Series 5: coronal st · coronal · 0.75mm/px · 3 of 77 slices shown]
[im 26/77  soft-tissue]
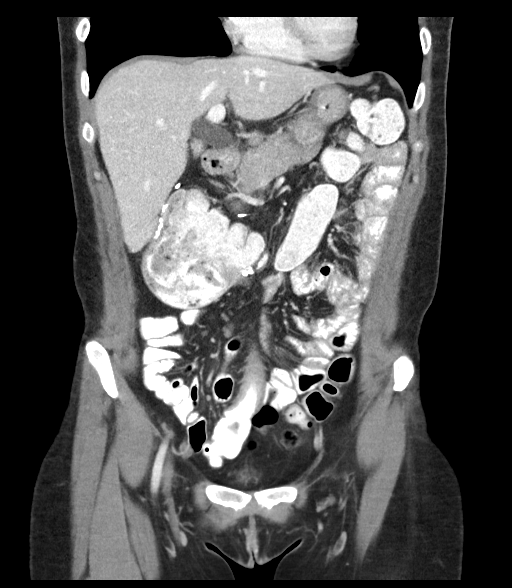
[im 34/77  soft-tissue]
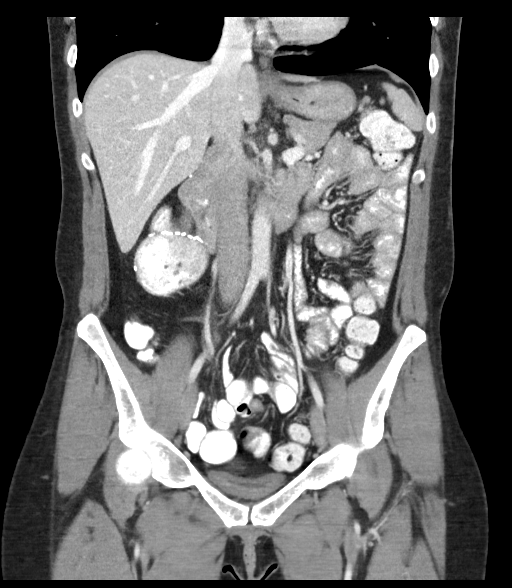
[im 43/77  soft-tissue]
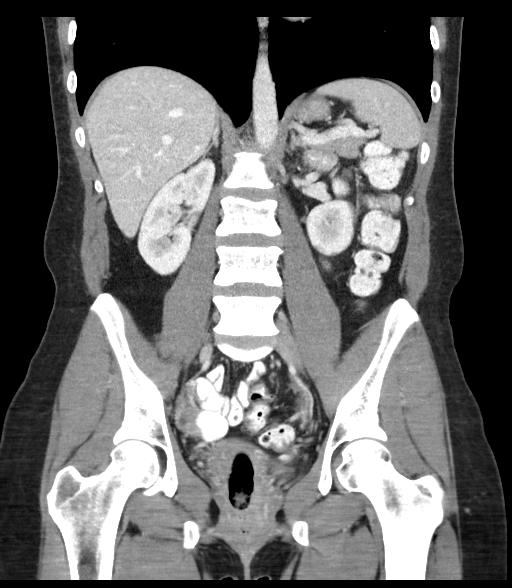

[16 of 46 positions shown; findings below may reference images not displayed]

FINDINGS: Lower chest: No acute abnormality.

Hepatobiliary: No focal liver abnormality is seen. Status post
cholecystectomy. No biliary dilatation.

Pancreas: Unremarkable. No pancreatic ductal dilatation or
surrounding inflammatory changes.

Spleen: Normal in size without significant abnormality.

Adrenals/Urinary Tract: Adrenal glands are unremarkable. Kidneys are
normal, without renal calculi, solid lesion, or hydronephrosis.
Bladder is unremarkable.

Stomach/Bowel: Stomach is within normal limits. Redemonstrated
postoperative findings of terminal ileocolectomy and reanastomosis.
There is new inflammatory bowel wall thickening of the most distal
remaining 10 cm of ileum at the anastomosis (series 5, image 23,
series 2, image 53).

Vascular/Lymphatic: No significant vascular findings are present. No
enlarged abdominal or pelvic lymph nodes.

Reproductive: Tampon in the vagina. Functional fluid in the
endometrial cavity. Bilateral ovarian cysts or follicles.

Other: No abdominal wall hernia or abnormality. No abdominopelvic
ascites.

Musculoskeletal: No acute or significant osseous findings.
IMPRESSION: Redemonstrated postoperative findings of terminal ileocolectomy and
reanastomosis. There is new inflammatory bowel wall thickening of
the most distal remaining 10 cm of ileum at the anastomosis. No
evidence of complicating fistula, abscess, or stricture. No evident
inflammatory involvement of the colon or rectum.

## 2021-03-01 ENCOUNTER — Other Ambulatory Visit: Payer: Self-pay

## 2021-03-01 ENCOUNTER — Ambulatory Visit (INDEPENDENT_AMBULATORY_CARE_PROVIDER_SITE_OTHER): Payer: BC Managed Care – PPO | Admitting: Gastroenterology

## 2021-03-01 ENCOUNTER — Encounter: Payer: Self-pay | Admitting: Gastroenterology

## 2021-03-01 ENCOUNTER — Telehealth: Payer: Self-pay

## 2021-03-01 ENCOUNTER — Other Ambulatory Visit (INDEPENDENT_AMBULATORY_CARE_PROVIDER_SITE_OTHER): Payer: BC Managed Care – PPO

## 2021-03-01 VITALS — BP 110/78 | HR 92 | Ht 62.0 in | Wt 145.0 lb

## 2021-03-01 DIAGNOSIS — K50919 Crohn's disease, unspecified, with unspecified complications: Secondary | ICD-10-CM | POA: Diagnosis not present

## 2021-03-01 DIAGNOSIS — R110 Nausea: Secondary | ICD-10-CM

## 2021-03-01 DIAGNOSIS — E538 Deficiency of other specified B group vitamins: Secondary | ICD-10-CM | POA: Diagnosis not present

## 2021-03-01 LAB — CBC WITH DIFFERENTIAL/PLATELET
Basophils Absolute: 0 10*3/uL (ref 0.0–0.1)
Basophils Relative: 0.5 % (ref 0.0–3.0)
Eosinophils Absolute: 0.1 10*3/uL (ref 0.0–0.7)
Eosinophils Relative: 0.8 % (ref 0.0–5.0)
HCT: 36.7 % (ref 36.0–46.0)
Hemoglobin: 12.1 g/dL (ref 12.0–15.0)
Lymphocytes Relative: 23.8 % (ref 12.0–46.0)
Lymphs Abs: 1.8 10*3/uL (ref 0.7–4.0)
MCHC: 33 g/dL (ref 30.0–36.0)
MCV: 84.1 fl (ref 78.0–100.0)
Monocytes Absolute: 0.4 10*3/uL (ref 0.1–1.0)
Monocytes Relative: 5.8 % (ref 3.0–12.0)
Neutro Abs: 5.2 10*3/uL (ref 1.4–7.7)
Neutrophils Relative %: 69.1 % (ref 43.0–77.0)
Platelets: 304 10*3/uL (ref 150.0–400.0)
RBC: 4.36 Mil/uL (ref 3.87–5.11)
RDW: 13.1 % (ref 11.5–15.5)
WBC: 7.5 10*3/uL (ref 4.0–10.5)

## 2021-03-01 LAB — COMPREHENSIVE METABOLIC PANEL
ALT: 12 U/L (ref 0–35)
AST: 12 U/L (ref 0–37)
Albumin: 3.9 g/dL (ref 3.5–5.2)
Alkaline Phosphatase: 54 U/L (ref 39–117)
BUN: 9 mg/dL (ref 6–23)
CO2: 28 mEq/L (ref 19–32)
Calcium: 8.7 mg/dL (ref 8.4–10.5)
Chloride: 104 mEq/L (ref 96–112)
Creatinine, Ser: 0.71 mg/dL (ref 0.40–1.20)
GFR: 108.34 mL/min (ref >60.00)
Glucose, Bld: 89 mg/dL (ref 70–99)
Potassium: 3.9 mEq/L (ref 3.5–5.1)
Sodium: 139 mEq/L (ref 135–145)
Total Bilirubin: 0.2 mg/dL (ref 0.2–1.2)
Total Protein: 6.7 g/dL (ref 6.0–8.3)

## 2021-03-01 LAB — VITAMIN B12: Vitamin B-12: 328 pg/mL (ref 211–911)

## 2021-03-01 LAB — FOLATE: Folate: 8.7 ng/mL (ref >5.9)

## 2021-03-01 LAB — C-REACTIVE PROTEIN: CRP: <1 mg/dL (ref 0.5–20.0)

## 2021-03-01 MED ORDER — PANTOPRAZOLE SODIUM 40 MG PO TBEC: 40.0000 mg | DELAYED_RELEASE_TABLET | Freq: Every day | ORAL | 3 refills | Status: DC

## 2021-03-01 MED ORDER — BUDESONIDE 3 MG PO CPEP: 9.0000 mg | ORAL_CAPSULE | Freq: Every day | ORAL | 2 refills | Status: DC

## 2021-03-01 NOTE — Progress Notes (Signed)
Chief Complaint: FU  Referring Provider:  Zoila Shutter, NP      ASSESSMENT AND PLAN;   #1.  Crohn's disease with anastomotic recurrence on CT 12/2019.  Humira was prescribed but unfortunately cost was too high and [name] did not take it   Dx 2005 at age 40 s/p R ileocolectomy (2005), with postoperative recurrance, moderate activity on colon 07/2015. Refused any meds for Crohn's d/t S/Es.  Self controlled on CBD oil.  Has associated IBS with diarrhea and element of postcholecystectomy diarrhea.   #2.  Associated B12 deficiency. Last B12 was normal.  #3. nausea  Plan: - Stool for GI pathogen, C. Diff, fecal calprotectin. - CBC, CMP, B12, folate, CRP, TPMT, TB Gold and HBsAg - Check anti HAV total - Needs vaccine for B - Entocort EC  73m po qd x 4 weeks, 2 refills - Recommend EGD/colonoscopy with MiraLAX. - Protonix 437mpo qd #30, 2 refills - Avoid NSAIDs. - Discussed regarding Humira again.  She will think about it and let [place].    I discussed the nature of the recommended EGD/Colonoscopy , as well as the indications, risks, alternatives and potential complications including, but not limited to, bleeding, infection, reaction to medication, damage to internal organs, cardiac and/or pulmonary problems, and perforation requiring surgery (1 to 2 in 1000). The possibility that significant findings could be missed was explained. All ? were answered. The patient gives consent for the procedures.  HPI:    Monica Barker a [age].o. female   For follow-up visit. Had diarrhea and abdominal pain after COVID February 2022.  Treated with prednisone. She underwent CT scan Abdo/pelvis December 28, 2019 which showed new inflammatory bowel wall thickening of neo-TI, 10 cm in length.  No fistulas, abscess or stricture. She was prescribed Humira.  Unfortunately, patient did not take it since the cost was too high and she wanted to use nontraditional ways to control Crohn's.   Comes to  GI clinic for follow-up visit. Still having diarrhea 6-7/day, watery without hematochezia.  She does have nocturnal symptoms.  Over the last 2 to 3 months she has developed LUQ pain.  No fever or chills.  No joint pains.  No significant weight loss.  Nausea, no vomiting, with occasional heartburn.  Thereafter, she started having increasing lower abdominal pain, diarrhea with watery bowel movements 5-10/day, most recently started having blood mixed with the stool.  She was previously scheduled for colonoscopy but she canceled.  Refused all medications for Crohn's disease in the past. As most of the medicines make her "crazy".  From previous labs: -CBC, CMP, B12, C-reactive protein was normal.    Past Medical History:  Diagnosis Date  . ADHD (attention deficit hyperactivity disorder)   . [place] ([place]  . [place]   Dx 2005 at age 40/p R hemicolectomy, with postoperative recurrance, moderate activity on colonoscopy 08/08/2015  . Depressive disorder   . Dysphagia   . Dysthymic disorder   . IBS (irritable bowel syndrome)   . IBS (irritable bowel syndrome)    with diarrhea  . Skin cancer   . TMJ (temporomandibular joint disorder)     Past Surgical History:  Procedure Laterality Date  . APPENDECTOMY    . CHOLECYSTECTOMY  2014  . COLON SURGERY  05/2004   due to Crohns  . COLON SURGERY  05/2004  . COLONOSCOPY  09/02/2017   Recurrance of Crohn's disease in the neo terminal ileum (biopsied)-  mild. Minimal rectal erythema (? importance-biopsed). No evidence of peranal Crohn's disease. Status post ileocolectomy.   . COLONOSCOPY  09/02/2017   Recurrance of Crohn's Disease in the neo terminal ileum (biopsed)-mild. Minimal rectal erythema (? Importance-biposed) No Evidence of peranal crohn's disease. Status post ileccolectomy.  . ESOPHAGOGASTRODUODENOSCOPY  10/03/2013   Mild gastritis. Normal EGD  . ESOPHAGOGASTRODUODENOSCOPY  10/03/2013   Mild Gastritis. Normal  EGD    Family History  Problem Relation Age of Onset  . Skin cancer Father   . Colon cancer Paternal Aunt        Paternal mom's sister/PGM's sister  . Skin cancer Paternal Grandmother   . Breast cancer Maternal Grandmother        happened twice/x2  . Esophageal cancer Neg Hx     Social History   Tobacco Use  . Smoking status: Former Research scientist (life sciences)  . Smokeless tobacco: Never Used  . Tobacco comment: 09/2019  Vaping Use  . Vaping Use: Some days  . Devices: occasionally   Substance Use Topics  . Alcohol use: Not Currently  . Drug use: Not Currently    Current Outpatient Medications  Medication Sig Dispense Refill  . ALPRAZolam (XANAX) 0.5 MG tablet Take 0.5 mg by mouth 3 (three) times daily as needed for anxiety.    . cyanocobalamin (,VITAMIN B-12,) 1000 MCG/ML injection INJECT 1 ML (1,000 MCG TOTAL) INTO THE MUSCLE EVERY 30 (THIRTY) DAYS. 1 mL 12  . Multiple Vitamin (MULTIVITAMIN) capsule Take 1 capsule by mouth daily.     No current facility-administered medications for this visit.    Allergies  Allergen Reactions  . Ativan [Lorazepam]     intesitided her anxiety   . Latex   . Latex Other (See Comments)    unknown    Review of Systems:  neg     Physical Exam:    BP 110/78 (BP Location: Left Arm, Patient Position: Sitting, Cuff Size: Normal)   Pulse 92   Ht 5' 2"  (1.575 m)   Wt 145 lb (65.8 kg)   BMI 26.52 kg/m  Filed Weights   03/01/21 1058  Weight: 145 lb (65.8 kg)   Gen: awake, alert, NAD HEENT: anicteric, no pallor CV: RRR, no mrg Pulm: CTA b/l Abd: soft, NT/ND, +BS throughout Ext: no c/c/e Neuro: nonfocal   Data Reviewed: I have personally reviewed following labs and imaging studies  CBC: CBC Latest Ref Rng & Units 12/27/2019 05/14/2008  WBC 4.0 - 10.5 K/uL 6.1 8.2  Hemoglobin 12.0 - 15.0 g/dL 13.4 13.9  Hematocrit 36.0 - 46.0 % 40.2 41.2  Platelets 150.0 - 400.0 K/uL 272.0 254    CMP: CMP Latest Ref Rng & Units 12/27/2019 05/14/2008  Glucose  70 - 99 mg/dL 84 103(H)  BUN 6 - 23 mg/dL 6 6  Creatinine 0.40 - 1.20 mg/dL 0.73 0.74  Sodium 135 - 145 mEq/L 137 137  Potassium 3.5 - 5.1 mEq/L 3.9 3.8  Chloride 96 - 112 mEq/L 106 108  CO2 19 - 32 mEq/L 26 25  Calcium 8.4 - 10.5 mg/dL 9.4 9.1  Total Protein 6.0 - 8.3 g/dL 7.3 6.6  Total Bilirubin 0.2 - 1.2 mg/dL 0.5 0.6  Alkaline Phos 39 - 117 U/L 45 45  AST 0 - 37 U/L 13 18  ALT 0 - 35 U/L 10 15        Carmell Austria, MD 03/01/2021, 11:16 AM  Cc: Zoila Shutter, NP

## 2021-03-01 NOTE — Telephone Encounter (Signed)
LVM for patient to call back.  Medication Entocort sent in. She will take 25m (3 tablets) daily for 4 weeks and then stop. She doesn't need to taper and she needs to keep per procedure date since she will receive more instructions if she needs to be on the medication again.  She can call back with any questions or concerns.

## 2021-03-01 NOTE — Patient Instructions (Addendum)
If you are age 38 or older, your body mass index should be between 23-30. Your Body mass index is 26.52 kg/m. If this is out of the aforementioned range listed, please consider follow up with your Primary Care Provider.  If you are age 68 or younger, your body mass index should be between 19-25. Your Body mass index is 26.52 kg/m. If this is out of the aformentioned range listed, please consider follow up with your Primary Care Provider.   Please go to the lab on the 2nd floor [address] before you leave the office today.   You have been scheduled for an endoscopy and colonoscopy. Please follow the written instructions given to you at your visit today. Please pick up your prep supplies at the pharmacy within the next 1-3 days. If you use inhalers (even only as needed), please bring them with you on the day of your procedure.  Avoid NSAID's  We have sent the following medications to your pharmacy for you to pick up at your convenience: Protonix   Please go to the lab on the 2nd floor [address] before you leave the office today.   Thank you,  Dr. Jackquline Denmark

## 2021-03-01 NOTE — Telephone Encounter (Signed)
Patient voiced understanding.

## 2021-03-13 ENCOUNTER — Ambulatory Visit (INDEPENDENT_AMBULATORY_CARE_PROVIDER_SITE_OTHER): Payer: BC Managed Care – PPO | Admitting: Gastroenterology

## 2021-03-13 ENCOUNTER — Other Ambulatory Visit: Payer: Self-pay

## 2021-03-13 DIAGNOSIS — Z23 Encounter for immunization: Secondary | ICD-10-CM

## 2021-03-19 LAB — QUANTIFERON-TB GOLD PLUS
Mitogen-NIL: >10 IU/mL
NIL: 0.04 IU/mL
QuantiFERON-TB Gold Plus: NEGATIVE
TB1-NIL: 0 IU/mL
TB2-NIL: 0 IU/mL

## 2021-03-19 LAB — HEPATITIS B SURFACE ANTIGEN: Hepatitis B Surface Ag: NONREACTIVE

## 2021-03-19 LAB — THIOPURINE S-METHYLTRANSFERASE (TPMT) GENOTYPE

## 2021-03-19 LAB — HEPATITIS A ANTIBODY, TOTAL: Hepatitis A AB,Total: NONREACTIVE

## 2021-04-16 ENCOUNTER — Telehealth: Payer: Self-pay

## 2021-04-16 ENCOUNTER — Other Ambulatory Visit: Payer: Self-pay

## 2021-04-16 ENCOUNTER — Ambulatory Visit (INDEPENDENT_AMBULATORY_CARE_PROVIDER_SITE_OTHER): Payer: BC Managed Care – PPO | Admitting: Gastroenterology

## 2021-04-16 DIAGNOSIS — Z23 Encounter for immunization: Secondary | ICD-10-CM

## 2021-04-16 NOTE — Telephone Encounter (Signed)
Patient stated that she has been having a lot of joint pain that has been more than normal. She does her vitamin b12 injections monthly but she said that she feels more sore and having shoulder pain a lot more often. She wants to know if there is anything she can do about this? I also told pt she can do her stool studies at [facility] since she said she cant bring it back to the lab here.  Order has been faxed. She has her procedure on Thursday and I mention she should tell you as well

## 2021-04-17 NOTE — Telephone Encounter (Signed)
Patient made aware and understands

## 2021-04-17 NOTE — Telephone Encounter (Signed)
For joint pains-not sure why? Can she see Irven Shelling (PCP) for those If she has to use ibuprofen she can.  I would prefer Tylenol if it works. If she continues to have joint problems, may need to see a rheumatologist.  We will let Irven Shelling decide. RG

## 2021-04-18 ENCOUNTER — Other Ambulatory Visit: Payer: Self-pay

## 2021-04-18 ENCOUNTER — Ambulatory Visit (AMBULATORY_SURGERY_CENTER): Payer: BC Managed Care – PPO | Admitting: Gastroenterology

## 2021-04-18 ENCOUNTER — Encounter: Payer: Self-pay | Admitting: Gastroenterology

## 2021-04-18 VITALS — BP 112/72 | HR 58 | Temp 98.0°F | Resp 13 | Ht 62.0 in | Wt 145.0 lb

## 2021-04-18 DIAGNOSIS — K2970 Gastritis, unspecified, without bleeding: Secondary | ICD-10-CM | POA: Diagnosis not present

## 2021-04-18 DIAGNOSIS — K621 Rectal polyp: Secondary | ICD-10-CM | POA: Diagnosis not present

## 2021-04-18 DIAGNOSIS — K50919 Crohn's disease, unspecified, with unspecified complications: Secondary | ICD-10-CM

## 2021-04-18 DIAGNOSIS — K6289 Other specified diseases of anus and rectum: Secondary | ICD-10-CM

## 2021-04-18 DIAGNOSIS — R110 Nausea: Secondary | ICD-10-CM

## 2021-04-18 DIAGNOSIS — D128 Benign neoplasm of rectum: Secondary | ICD-10-CM

## 2021-04-18 DIAGNOSIS — K5000 Crohn's disease of small intestine without complications: Secondary | ICD-10-CM

## 2021-04-18 MED ORDER — PREDNISONE 10 MG PO TABS: ORAL_TABLET | ORAL | 0 refills | Status: DC

## 2021-04-18 MED ORDER — SODIUM CHLORIDE 0.9 % IV SOLN: 500.0000 mL | Freq: Once | INTRAVENOUS | Status: DC

## 2021-04-18 NOTE — Op Note (Signed)
[place] Patient Name: Monica Barker Procedure Date: 04/18/2021 1:31 PM MRN: [id number] Endoscopist: Jackquline Denmark , MD Age: 40 Referring MD:  Date of Birth: 1980/12/08 Gender: Female Account #: [id number] Procedure:                Colonoscopy Indications:              #1. Crohn's disease. Dx 2005 at age 10 s/p R                            ileocolectomy (2005), with postoperative                            recurrance, moderate activity on colon 07/2015.                            Refused any meds for Crohn's d/t S/Es. Self                            controlled on CBD oil. Has associated IBS with                            diarrhea and element of postcholecystectomy                            diarrhea.                           #2. Associated B12 deficiency. Medicines:                Monitored Anesthesia Care Procedure:                Pre-Anesthesia Assessment:                           - Prior to the procedure, a History and Physical                            was performed, and patient medications and                            allergies were reviewed. The patient's tolerance of                            previous anesthesia was also reviewed. The risks                            and benefits of the procedure and the sedation                            options and risks were discussed with the patient.                            All questions were answered, and informed consent  was obtained. Prior Anticoagulants: The patient has                            taken no previous anticoagulant or antiplatelet                            agents. ASA Grade Assessment: II - A patient with                            mild systemic disease. After reviewing the risks                            and benefits, the patient was deemed in                            satisfactory condition to undergo the procedure.                           After obtaining informed  consent, the colonoscope                            was passed under direct vision. Throughout the                            procedure, the patient's blood pressure, pulse, and                            oxygen saturations were monitored continuously. The                            Olympus [id number]-[id number] (#[id number]) Colonoscope was                            introduced through the anus and advanced to the the                            ileocolonic anastomosis. 4 cm of neoterminal ileum                            was intubated. The colonoscopy was performed                            without difficulty. The patient tolerated the                            procedure well. The quality of the bowel                            preparation was good. The neo-terminal ileum,                            neo-cecum and rectum were photographed. Scope In: 1:51:01 PM Scope Out: 2:05:26 PM Scope Withdrawal Time: 0 hours 10 minutes 42 seconds  Total Procedure Duration: 0  hours 14 minutes 25 seconds  Findings:                 Diffuse inflammation, graded as Rutgeerts Score i4                            (diffuse inflammation with large deep lesions                            and/or narrowing) was found in the neo-terminal                            ileum. Biopsies were taken with a cold forceps for                            histology.                           There was evidence of a prior end-to-side                            ileo-colonic anastomosis in the cecum.                           The colon (entire examined portion) appeared                            normal. Biopsies were taken with a cold forceps for                            histology.                           The perianal and digital rectal examinations were                            normal except for circumferential scarring. No                            active Crohn's disease.                           A 4 mm polyp was found in the  rectum. The polyp was                            sessile. The polyp was removed with a cold biopsy                            forceps. Resection and retrieval were complete. Complications:            No immediate complications. Estimated Blood Loss:     Estimated blood loss: none. Estimated blood loss:                            none. 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                            Biopsied.                           - Diminutive colonic polyp s/p polypectomy.                           - No colonic or perianal involvement. Recommendation:           - Patient has a contact number available for                            emergencies. The signs and symptoms of potential                            delayed complications were discussed with the                            patient. Return to normal activities tomorrow.                            Written discharge instructions were provided to the                            patient.                           - Resume previous diet.                           - Continue present medications.                           - Await pathology results.                           - The findings and recommendations were discussed                            with the designated responsible adult.                           - Her CBC, CMP, CRP, TB test and hepatitis B                            surface antigen was neg. Proceed with vaccination                            for hepatitis A/B. She has not responded to Entocort                           - Trial of prednisone  40 mg p.o. QD x 2 weeks, 30                            mg p.o.QD x 2 weeks, then 20 mg p.o. once a day x 2                            weeks, then 10 mg p.o. once a day x 2 weeks.                           - Recommend biologic therapy-Remicade vs Humira.                             Patient wants to think about it and will get back                            to [place]. We will get it approved again from insurance                            company.                           - FU in GI clinic in 3 to 4 weeks. Jackquline Denmark, MD 04/18/2021 2:24:47 PM This report has been signed electronically.

## 2021-04-18 NOTE — Patient Instructions (Addendum)
Take your prednisone as directed.  Resume your previous medications.  Read all discharge instructions.   YOU HAD AN ENDOSCOPIC PROCEDURE TODAY AT [facility]:   Refer to the procedure report that was given to you for any specific questions about what was found during the examination.  If the procedure report does not answer your questions, please call your gastroenterologist to clarify.  If you requested that your care partner not be given the details of your procedure findings, then the procedure report has been included in a sealed envelope for you to review at your convenience later.  YOU SHOULD EXPECT: Some feelings of bloating in the abdomen. Passage of more gas than usual.  Walking can help get rid of the air that was put into your GI tract during the procedure and reduce the bloating. If you had a lower endoscopy (such as a colonoscopy or flexible sigmoidoscopy) you may notice spotting of blood in your stool or on the toilet paper. If you underwent a bowel prep for your procedure, you may not have a normal bowel movement for a few days.  Please Note:  You might notice some irritation and congestion in your nose or some drainage.  This is from the oxygen used during your procedure.  There is no need for concern and it should clear up in a day or so.  SYMPTOMS TO REPORT IMMEDIATELY:  Following lower endoscopy (colonoscopy or flexible sigmoidoscopy):  Excessive amounts of blood in the stool  Significant tenderness or worsening of abdominal pains  Swelling of the abdomen that is new, acute  Fever of 100F or higher  Upper scope instructions given too.  For urgent or emergent issues, a gastroenterologist can be reached at any hour by calling [phone]. Do not use MyChart messaging for urgent concerns.    DIET:  We do recommend a small meal at first, but then you may proceed to your regular diet.  Drink plenty of fluids but you should avoid alcoholic beverages for 24  hours.  ACTIVITY:  You should plan to take it easy for the rest of today and you should NOT DRIVE or use heavy machinery until tomorrow (because of the sedation medicines used during the test).    FOLLOW UP: Our staff will call the number listed on your records 48-72 hours following your procedure to check on you and address any questions or concerns that you may have regarding the information given to you following your procedure. If we do not reach you, we will leave a message.  We will attempt to reach you two times.  During this call, we will ask if you have developed any symptoms of COVID 19. If you develop any symptoms (ie: fever, flu-like symptoms, shortness of breath, cough etc.) before then, please call [phone].  If you test positive for Covid 19 in the 2 weeks post procedure, please call and report this information to [place].    If any biopsies were taken you will be contacted by phone or by letter within the next 1-3 weeks.  Please call us at [phone] if you have not heard about the biopsies in 3 weeks.    SIGNATURES/CONFIDENTIALITY: You and/or your care partner have signed paperwork which will be entered into your electronic medical record.  These signatures attest to the fact that that the information above on your After Visit Summary has been reviewed and is understood.  Full responsibility of the confidentiality of this discharge information lies with you  and/or your care-partner.

## 2021-04-18 NOTE — Op Note (Signed)
[facility] Patient Name: Monica Barker Procedure Date: 04/18/2021 1:32 PM MRN: [id number] Endoscopist: Jackquline Denmark , MD Age: 40 Referring MD:  Date of Birth: [birth year] Gender: Female Account #: [id number] Procedure:                Upper GI endoscopy Indications:              Nausea. H/O Crohn's disease Medicines:                Monitored Anesthesia Care Procedure:                Pre-Anesthesia Assessment:                           - Prior to the procedure, a History and Physical                            was performed, and patient medications and                            allergies were reviewed. The patient's tolerance of                            previous anesthesia was also reviewed. The risks                            and benefits of the procedure and the sedation                            options and risks were discussed with the patient.                            All questions were answered, and informed consent                            was obtained. Prior Anticoagulants: The patient has                            taken no previous anticoagulant or antiplatelet                            agents. ASA Grade Assessment: II - A patient with                            mild systemic disease. After reviewing the risks                            and benefits, the patient was deemed in                            satisfactory condition to undergo the procedure.                           After obtaining informed consent, the endoscope was  passed under direct vision. Throughout the                            procedure, the patient's blood pressure, pulse, and                            oxygen saturations were monitored continuously. The                            GIF [id number] #[id number] was introduced through the                            mouth, and advanced to the second part of duodenum.                            The upper GI endoscopy was  accomplished without                            difficulty. The patient tolerated the procedure                            well. Scope In: Scope Out: Findings:                 The examined esophagus was normal with well-defined                            Z-line at 38 cm, examined by NBI.                           Localized minimal inflammation characterized by                            erythema was found in the gastric antrum. Biopsies                            were taken with a cold forceps for histology.                           The examined duodenum was normal. Biopsies for                            histology were taken with a cold forceps for                            evaluation of celiac disease. Complications:            No immediate complications. Estimated Blood Loss:     Estimated blood loss: none. Impression:               - Minimal gastritis. No evidence of upper GI                            Crohn's disease. Recommendation:           - Patient has a contact number available for  emergencies. The signs and symptoms of potential                            delayed complications were discussed with the                            patient. Return to normal activities tomorrow.                            Written discharge instructions were provided to the                            patient.                           - Resume previous diet.                           - Continue present medications.                           - Await pathology results.                           - The findings and recommendations were discussed                            with the designated responsible adult. Jackquline Denmark, MD 04/18/2021 2:10:21 PM This report has been signed electronically.

## 2021-04-18 NOTE — Progress Notes (Signed)
Medical history reviewed with no changes noted. VS assessed by C.W 

## 2021-04-18 NOTE — Progress Notes (Signed)
pt tolerated well. VSS. awake and to recovery. Report given to RN.  

## 2021-04-18 NOTE — Progress Notes (Signed)
Called to room to assist during endoscopic procedure.  Patient ID and intended procedure confirmed with present staff. Received instructions for my participation in the procedure from the performing physician.  

## 2021-04-23 ENCOUNTER — Telehealth: Payer: Self-pay

## 2021-04-23 NOTE — Telephone Encounter (Signed)
Spoke with Otho Bellows at [facility] in [place] to call in prednisone prescription.  The pharmacy did not have this order on file.

## 2021-04-23 NOTE — Telephone Encounter (Signed)
  Follow up Call-  Call back number 04/18/2021  Post procedure Call Back phone  # [phone]  Permission to leave phone message Yes  Some recent data might be hidden     Patient questions:  Do you have a fever, pain , or abdominal swelling? Yes.   Pain Score  4 **pt to begin prednisone - pharmacy didn't have medication when she checked with them day of procedure.  I will call pharmacy to check with them today.  Order was put in computer day of procedure.  Have you tolerated food without any problems? Yes.    Have you been able to return to your normal activities? Yes.    Do you have any questions about your discharge instructions: Diet   No. Medications  Yes.   Follow up visit  No.  Do you have questions or concerns about your Care? No.  Actions: * If pain score is 4 or above: No action needed, pain <4.

## 2021-05-01 ENCOUNTER — Telehealth: Payer: Self-pay | Admitting: Gastroenterology

## 2021-05-01 NOTE — Telephone Encounter (Signed)
Inbound call from pt requesting a call back stating that she is having side effects from the medication Prednisone. Please advise. Thanks.

## 2021-05-01 NOTE — Telephone Encounter (Signed)
Patient currently on prednisone since procedure. Pt reports that she is having chest tightness, shortness of breath, swelling in her joints for "months." Patient says these same symptoms occurred when her father was also taking prednisone and she feels she "just cant take it" Pt requesting rec's   Patient also states she was told she was to be starting IV infusion "maybe humira." But was waiting on insurance.? I did not see anything in chart from GI standpoint of starting this medication. I did inform patient I saw mention of her to possibly see a rheumatologist and that may be where she would begin IV treatment.? Please advise  Last OV 03/01/21 Last Procedure 04/18/21 Prednisone 4 10 mg tablets daily x two weeks.  3 10 mg tablets per day x 2 weeks.  2 66m tablets per day x 2 weeks., Print  Dispense: 168 tablet  Refills: 0 ordered      Ordered on: 04/18/21  Associated Dx: Crohn's disease with complication, unspecified gastrointestinal tract location ([facility]; Nausea without vomiting; Gastritis, presence of bleeding unspecified, unspecified chronicity, unspecified gastritis type

## 2021-05-05 NOTE — Progress Notes (Signed)
I have discussed Bx results with pt in detail Bx- Neo-terminal ileitis c/w post-op recurrence Plan: -Have decreased pred 74m x 3 days, then 224mpo QD x 2 weeks, 104mo qd x 2 weeks, then stop (d/t palpitations/flushing) -Start Humira Day 1- 160m81may 15 - 80mg69men 40mg 9mvery other week, starting day 29. [name], may have to get it approved from [employer]. She is agreeable -Appt with rheumatologist Dr ShialiLorenz Coasterjt pains)   RG   [name] No need for letter Recall colon 5 yrs (earlier if clinically indicated) RG     Send report to family physician

## 2021-05-06 ENCOUNTER — Telehealth: Payer: Self-pay | Admitting: Gastroenterology

## 2021-05-06 NOTE — Telephone Encounter (Signed)
Inbound call from pt stating that she was returning your phone call. Thanks

## 2021-05-06 NOTE — Telephone Encounter (Signed)
Please see result note encounter.

## 2021-05-21 ENCOUNTER — Other Ambulatory Visit: Payer: Self-pay | Admitting: Gastroenterology

## 2021-05-25 ENCOUNTER — Other Ambulatory Visit: Payer: Self-pay | Admitting: Gastroenterology

## 2021-05-25 DIAGNOSIS — R110 Nausea: Secondary | ICD-10-CM

## 2021-05-25 DIAGNOSIS — K2970 Gastritis, unspecified, without bleeding: Secondary | ICD-10-CM

## 2021-05-25 DIAGNOSIS — K50919 Crohn's disease, unspecified, with unspecified complications: Secondary | ICD-10-CM

## 2021-05-27 ENCOUNTER — Telehealth: Payer: Self-pay | Admitting: Gastroenterology

## 2021-05-27 NOTE — Telephone Encounter (Signed)
Spoke with patient, patient requesting referral sent rheumatologist, per her and Dr Lyndel Safe encounter Dr Lyndel Safe if could please advise if there a specific office referral information should be sent?

## 2021-05-27 NOTE — Telephone Encounter (Signed)
Dr Elsie Lincoln please RG

## 2021-05-27 NOTE — Telephone Encounter (Signed)
Spoke with patient, patient made aware of MD that she has been referred to. Pt given information as to when their office contact her and her information will be sent via fax to Dr Theressa Stamps.

## 2021-06-26 ENCOUNTER — Telehealth: Payer: Self-pay | Admitting: Gastroenterology

## 2021-06-26 NOTE — Telephone Encounter (Signed)
Inbound call from patient. States her crohns is having a flare. States she feels sick and in pain all the time. Says she is unable to perform work duties due to pain. Is asking if she can have her FMLA papers filled out to take time to feel better.

## 2021-06-26 NOTE — Telephone Encounter (Signed)
Spoke with patient. Patient states she is waiting on rheumatologist for appt to decide if patient needs Humira or IV therapy.Patient states she feels awful and like her health is declining.States she has ran out of Bentyl and for pain she has been taking Tylenol and Motrin. Pain is 8/10.Patient declines any steroid treatment.Pt nauseated and fevers on and off. Per patient the pain in her joints is effecting her job position as she is doing lifting and pulling. Patient last Endo/Colon in June 2022. Patient states she has short and long term insurance. Patient is afraid of losing job states "I have a child, Im paying for braces, I have bills and I can not afford to lose my job." Patient is requesting FMLA paperwork so she is covered for her job. Please advise.

## 2021-06-26 NOTE — Telephone Encounter (Signed)
It is definitely not easy.  She does have postoperative recurrence of Crohn's disease.  Would recommend starting Humira Day 1- 171m, day 15 - 856m then 4039mQ every other week, starting day 29.   I am certain she will feel better.  She just needs to give it a good try.  More than happy to fill out any FMLA papers-not sure if will help.   RG

## 2021-06-27 ENCOUNTER — Telehealth: Payer: Self-pay

## 2021-06-27 NOTE — Telephone Encounter (Signed)
Humira PA has been started patient is aware. Pt aware we will contact her when we receive outcome. Patient also requesting FMLA paperwork to be filled out. Will contact her once complete

## 2021-06-27 NOTE — Telephone Encounter (Signed)
Spoke with patient. Patient made aware to bring her FMLA paperwork from from job to our [employer] office and we will fill out with out form as well. Patient informed that Dr Lyndel Safe is out for 3 weeks and will complete then. Patient verbalized understanding , nothing further needed at this time.

## 2021-06-27 NOTE — Telephone Encounter (Signed)
Patient aware Dr Lyndel Safe will be out for 3 weeks. She is to send her jobs FMLA paperwork to fillout with our office's.

## 2021-06-27 NOTE — Telephone Encounter (Signed)
Information entered for [employer] to sign patient up with a Nurse Ambassador. PA started through [employer]

## 2021-07-03 NOTE — Telephone Encounter (Signed)
Spoke with patient. Patient has not sent in her FMLA papers as of yet, states she will when Dr Lyndel Safe returns.

## 2021-07-09 NOTE — Telephone Encounter (Signed)
FMLA paperwork here, being worked on. Waiting for Dr Lyndel Safe to return in order to complete.

## 2021-07-10 ENCOUNTER — Other Ambulatory Visit: Payer: Self-pay

## 2021-07-10 MED ORDER — HUMIRA (2 PEN) 40 MG/0.4ML ~~LOC~~ AJKT: AUTO-INJECTOR | SUBCUTANEOUS | 6 refills | Status: DC

## 2021-07-10 MED ORDER — HUMIRA-CD/UC/HS STARTER 80 MG/0.8ML ~~LOC~~ AJKT: AUTO-INJECTOR | SUBCUTANEOUS | 0 refills | Status: DC

## 2021-07-11 NOTE — Telephone Encounter (Signed)
Patient was made aware again that Dr Lyndel Safe will be in the office on the 28th is he is the only one that can sign her FLMA paperwork. She already knew this from 06-27-2021. Patient refused any medical treatment regarding her current symptoms and was advised to go the ER if she is still having problems or worsening symptoms and she said she wouldn't go.

## 2021-07-11 NOTE — Telephone Encounter (Signed)
Patient was made aware again that D

## 2021-07-16 NOTE — Progress Notes (Signed)
Office Visit Note  Patient: Monica Barker             Date of Birth: [birth year]           MRN: [id number]             PCP: Zoila Shutter, NP Referring: Jackquline Denmark, MD Visit Date: 07/30/2021 Occupation: @GUAROCC @  Subjective:  Pain in multiple joints.   History of Present Illness: Monica Barker is a 40 y.o. female seen in consultation per request of Dr. Lyndel Safe.  According to the patient in 2006 5 while she was 6 months pregnant she started having fever and abdominal pain.  She was taken to the emergency room and was diagnosed with appendix rupture and underwent appendectomy.  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  She does not recall as she was very sick how she was treated.  A month later she went into labor and had a premature delivery.  She states a month after the childbirth she had intestinal rupture and at the time she was diagnosed with Crohn's disease.  She underwent partial colectomy.  She states she did well for 17 years and stayed asymptomatic.  In 2020 she developed COVID-19 virus infection soon after that she started experiencing fever, diarrhea, abdominal discomfort and joint pain.  She states they were flulike symptoms.  Abdominal pain persist.  She eventually saw Dr.Gupta in May 2022.  He placed her on prednisone taper.  She states she had normal side effects from prednisone she developed palpitations chest pain and shortness of breath.  Then she was placed on budesonide.  She underwent endoscopy and colonoscopy.  She states that in June 2022 Dr. Lyndel Safe applied for Humira.  The Humira has not been approved yet.  She continues to has abdominal symptoms.  She is still having 10 loose stools per day with blood and mucus.  She complains of pain and discomfort in her shoulders, elbows, wrists, hands, hips, knees, and ankles.  She states she notices some swelling in her knee joints.  There is no family history of IBD or autoimmune disease.  Activities of Daily  Living:  Patient reports morning stiffness for all day. Patient Reports nocturnal pain.  Difficulty dressing/grooming: Denies Difficulty climbing stairs: Reports Difficulty getting out of chair: Reports Difficulty using hands for taps, buttons, cutlery, and/or writing: Denies  Review of Systems  Constitutional:  Positive for fatigue.  HENT:  Positive for mouth dryness. Negative for mouth sores and nose dryness.   Eyes:  Negative for pain, itching and dryness.  Respiratory:  Positive for shortness of breath. Negative for difficulty breathing.        With anxiety.  Cardiovascular:  Positive for palpitations. Negative for chest pain.  Gastrointestinal:  Positive for abdominal pain, blood in stool and diarrhea. Negative for constipation.  Endocrine: Negative for increased urination.  Genitourinary:  Negative for difficulty urinating.  Musculoskeletal:  Positive for joint pain, joint pain, joint swelling and morning stiffness. Negative for myalgias, muscle tenderness and myalgias.  Skin:  Positive for color change. Negative for rash, redness and sensitivity to sunlight.  Allergic/Immunologic: Negative for susceptible to infections.  Neurological:  Positive for headaches. Negative for dizziness and numbness.  Hematological:  Negative for bruising/bleeding tendency.  Psychiatric/Behavioral:  Positive for sleep disturbance. The patient is nervous/anxious.    PMFS History:  There are no problems to display for this patient.   Past Medical History:  Diagnosis Date   ADHD (attention deficit hyperactivity  disorder)    Bipolar disorder ([place])    COVID-19 virus infection    11/22/2019 tested positive   Crohn disease ([place])    Dx 2005 at age 36 s/p R hemicolectomy, with postoperative recurrance, moderate activity on colonoscopy 08/08/2015   Depressive disorder    Dysphagia    Dysthymic disorder    IBS (irritable bowel syndrome)    IBS (irritable bowel syndrome)    with diarrhea   Skin cancer     TMJ (temporomandibular joint disorder)     Family History  Problem Relation Age of Onset   Skin cancer Father    COPD Father    Gout Father    Arthritis Father    Hypertension Father    Diabetes Brother    Drug abuse Brother    Colon cancer Paternal Aunt        Paternal mom's sister/PGM's sister   Breast cancer Maternal Grandmother        happened twice/x2   Skin cancer Paternal Grandmother    Endometriosis Sister    ADD / ADHD Son    Healthy Son    Esophageal cancer Neg Hx    Past Surgical History:  Procedure Laterality Date   APPENDECTOMY     CHOLECYSTECTOMY  2014   COLON SURGERY  05/2004   due to [place]  05/2004   COLONOSCOPY  09/02/2017   Recurrance of Crohn's disease in the neo terminal ileum (biopsied)- mild. Minimal rectal erythema (? importance-biopsed). No evidence of peranal Crohn's disease. Status post ileocolectomy.    COLONOSCOPY  09/02/2017   Recurrance of Crohn's Disease in the neo terminal ileum (biopsed)-mild. Minimal rectal erythema (? Importance-biposed) No Evidence of peranal crohn's disease. Status post ileccolectomy.   ESOPHAGOGASTRODUODENOSCOPY  10/03/2013   Mild gastritis. Normal EGD   ESOPHAGOGASTRODUODENOSCOPY  10/03/2013   Mild Gastritis. Normal EGD   Social History   Social History Narrative   ** Merged History Encounter **       Immunization History  Administered Date(s) Administered   Hep A / Hep B 03/13/2021, 04/16/2021     Objective: Vital Signs: BP 136/69 (BP Location: Right Arm, Patient Position: Sitting, Cuff Size: Normal)   Pulse 99   Ht 5' 3"  (1.6 m)   Wt 162 lb 12.8 oz (73.8 kg)   BMI 28.84 kg/m    Physical Exam Vitals and nursing note reviewed.  Constitutional:      Appearance: She is well-developed.  HENT:     Head: Normocephalic and atraumatic.  Eyes:     Conjunctiva/sclera: Conjunctivae normal.  Cardiovascular:     Rate and Rhythm: Normal rate and regular rhythm.     Heart sounds: Normal  heart sounds.  Pulmonary:     Effort: Pulmonary effort is normal.     Breath sounds: Normal breath sounds.  Abdominal:     General: Bowel sounds are normal.     Palpations: Abdomen is soft.  Musculoskeletal:     Cervical back: Normal range of motion.  Lymphadenopathy:     Cervical: No cervical adenopathy.  Skin:    General: Skin is warm and dry.     Capillary Refill: Capillary refill takes less than 2 seconds.  Neurological:     Mental Status: She is alert and oriented to person, place, and time.  Psychiatric:        Behavior: Behavior normal.     Musculoskeletal Exam: C-spine thoracic and lumbar spine were in good range of motion.  She  had tenderness over SI joints.  Shoulder joints were in good range of motion.  No effusion was noted.  There was no tenderness over her elbows.  There was no warmth swelling or tenderness over her wrist joints, PIPs, DIPs.  Hip joints with good range of motion.  She tenderness over bilateral SI joints.  She had good range of motion of her knee joints without any warmth swelling or effusion but she had discomfort range of motion of bilateral knee joints.  She had tenderness across MTPs and PIPs with no synovitis.  There was no evidence of plantar fasciitis or Achilles tendinitis.  CDAI Exam: CDAI Score: -- Patient Global: --; Provider Global: -- Swollen: --; Tender: -- Joint Exam 07/30/2021   No joint exam has been documented for this visit   There is currently no information documented on the homunculus. Go to the Rheumatology activity and complete the homunculus joint exam.  Investigation: No additional findings.  Imaging: No results found.  Recent Labs: Lab Results  Component Value Date   WBC 7.5 03/01/2021   HGB 12.1 03/01/2021   PLT 304.0 03/01/2021   NA 139 03/01/2021   K 3.9 03/01/2021   CL 104 03/01/2021   CO2 28 03/01/2021   GLUCOSE 89 03/01/2021   BUN 9 03/01/2021   CREATININE 0.71 03/01/2021   BILITOT 0.2 03/01/2021    ALKPHOS 54 03/01/2021   AST 12 03/01/2021   ALT 12 03/01/2021   PROT 6.7 03/01/2021   ALBUMIN 3.9 03/01/2021   CALCIUM 8.7 03/01/2021   GFRAA  05/14/2008    >60        The eGFR has been calculated using the MDRD equation. This calculation has not been validated in all clinical   QFTBGOLDPLUS NEGATIVE 03/01/2021   December 27, 2019 TB Gold negative, hepatitis B negative, CRP negative 06/01/2021 TB Gold negative, hepatitis B-, CRP negative, hepatitis A-    Speciality Comments: No specialty comments available.  Procedures:  No procedures performed Allergies: Ativan [lorazepam], Latex, and Latex   Assessment / Plan:     Visit Diagnoses: Polyarthralgia-she complains of pain and discomfort in multiple joints for the last few months since she had COVID-19 infection.  She had no synovitis on examination.  Chronic pain of both shoulders -she had painful range of motion of bilateral shoulder joints without any effusion.  Plan: XR Shoulder Left, XR Shoulder Right, x-ray of the shoulder joints were unremarkable.  Sedimentation rate, Rheumatoid factor, Cyclic citrul peptide antibody, IgG, ANA  Pain in both hands -she complains of discomfort in her bilateral hands with intermittent swelling.  No synovitis was noted.  Plan: XR Hand 2 View Right, XR Hand 2 View Left.  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  Chronic SI joint pain -she had tenderness on palpation over bilateral SI joints.  Plan: XR Pelvis 1-2 Views.  X-rays of the pelvis were unremarkable.  Chronic pain of both knees -she complains of pain and discomfort in her bilateral knees.  No joint swelling, warmth or effusion was noted.  Plan: XR KNEE 3 VIEW RIGHT, XR KNEE 3 VIEW LEFT.  X-rays of bilateral knee joints were unremarkable except for left knee joint showed mild chondromalacia patella.  Pain in both feet -she complains of pain in her ankles and feet.  No warmth swelling or effusion was noted.  Plan: XR Foot 2  Views Right, XR Foot 2 Views Left.  X-rays of bilateral feet were unremarkable.  History of Crohn's disease -  04/18/21 colonoscopy: Moderate to severe neoterminal ileitis, consistent with postoperative crohn's recurrence.  Patient is having active disease with 10 loose watery diarrhea stools daily.  She also reports mucus and blood in her stools.  She complains of abdominal discomfort and cramps.  She is awaiting approval for Humira to start on the medication.  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  2005 per patient.  High risk medication use-patient was given a prednisone taper in May which caused a lot of side effects.  She also had a prescription of budesonide and she Raynauds the medication.  Humira was applied in June and is not approved yet.  I will obtain some additional labs today.Plan: Hepatitis C antibody, Serum protein electrophoresis with reflex, IgG, IgA, IgM, HIV Antibody (routine testing w rflx)  Essential hypertension  History of hyperlipidemia  Palpitations  History of type 2 diabetes mellitus  Vitamin D deficiency  Vitamin B12 deficiency  GAD (generalized anxiety disorder)  History of hypothyroidism  Chronic fatigue syndrome Plan: CK, TSH  Other insomnia    Orders: Orders Placed This Encounter  Procedures   XR Shoulder Left   XR Shoulder Right   XR Pelvis 1-2 Views   XR KNEE 3 VIEW RIGHT   XR KNEE 3 VIEW LEFT   XR Hand 2 View Right   XR Hand 2 View Left   XR Foot 2 Views Right   XR Foot 2 Views Left   Sedimentation rate   CK   TSH   Rheumatoid factor   Cyclic citrul peptide antibody, IgG   ANA   Serum protein electrophoresis with reflex   IgG, IgA, IgM   HIV Antibody (routine testing w rflx)    No orders of the defined types were placed in this encounter.    Follow-Up Instructions: Return for Arthralgias, Crohn's disease.   Bo Merino, MD  Note - This record has been  created using Editor, commissioning.  Chart creation errors have been sought, but may not always  have been located. Such creation errors do not reflect on  the standard of medical care.

## 2021-07-17 ENCOUNTER — Telehealth: Payer: Self-pay

## 2021-07-17 ENCOUNTER — Telehealth: Payer: Self-pay | Admitting: Gastroenterology

## 2021-07-17 NOTE — Telephone Encounter (Signed)
Currently working on Prior Authorization. Pt aware. Pt states that she has been out of work since Sept 14th. [facility] . Pt stated that she will reach out to her insurance company in regard to the [facility] paperwork. Pt given Fax number [phone]

## 2021-07-17 NOTE — Telephone Encounter (Signed)
Inbound call from patient. States Humira needs prior [place]

## 2021-07-19 NOTE — Telephone Encounter (Signed)
[employer] faxed today 07-19-2021

## 2021-07-19 NOTE — Telephone Encounter (Signed)
New order for Humira called to [facility] for starter kit and maintenance dose. Records faxed to [employer] for them to do prior auth.

## 2021-07-26 ENCOUNTER — Telehealth: Payer: Self-pay | Admitting: Gastroenterology

## 2021-07-26 NOTE — Telephone Encounter (Signed)
Additional information was submitted to the [employer] this AM regarding the prior Auth for Humira.  Pt was notified and made aware. Pt verbalized understanding with all questions answered.

## 2021-07-26 NOTE — Telephone Encounter (Signed)
Inbound call from pt requesting a call back stating that Humira has not been approved. Please advise. Thank you.

## 2021-07-26 NOTE — Telephone Encounter (Signed)
Vaughan Basta can you help with this please?

## 2021-07-30 ENCOUNTER — Encounter: Payer: Self-pay | Admitting: Rheumatology

## 2021-07-30 ENCOUNTER — Ambulatory Visit (INDEPENDENT_AMBULATORY_CARE_PROVIDER_SITE_OTHER): Payer: BC Managed Care – PPO | Admitting: Rheumatology

## 2021-07-30 ENCOUNTER — Ambulatory Visit: Payer: Self-pay

## 2021-07-30 ENCOUNTER — Other Ambulatory Visit: Payer: Self-pay

## 2021-07-30 VITALS — BP 136/69 | HR 99 | Ht 63.0 in | Wt 162.8 lb

## 2021-07-30 DIAGNOSIS — M25512 Pain in left shoulder: Secondary | ICD-10-CM

## 2021-07-30 DIAGNOSIS — M25511 Pain in right shoulder: Secondary | ICD-10-CM | POA: Diagnosis not present

## 2021-07-30 DIAGNOSIS — M533 Sacrococcygeal disorders, not elsewhere classified: Secondary | ICD-10-CM | POA: Diagnosis not present

## 2021-07-30 DIAGNOSIS — G8929 Other chronic pain: Secondary | ICD-10-CM

## 2021-07-30 DIAGNOSIS — G9332 Myalgic encephalomyelitis/chronic fatigue syndrome: Secondary | ICD-10-CM

## 2021-07-30 DIAGNOSIS — E538 Deficiency of other specified B group vitamins: Secondary | ICD-10-CM

## 2021-07-30 DIAGNOSIS — R5382 Chronic fatigue, unspecified: Secondary | ICD-10-CM

## 2021-07-30 DIAGNOSIS — M25561 Pain in right knee: Secondary | ICD-10-CM | POA: Diagnosis not present

## 2021-07-30 DIAGNOSIS — M79642 Pain in left hand: Secondary | ICD-10-CM | POA: Diagnosis not present

## 2021-07-30 DIAGNOSIS — M2550 Pain in unspecified joint: Secondary | ICD-10-CM | POA: Diagnosis not present

## 2021-07-30 DIAGNOSIS — M79641 Pain in right hand: Secondary | ICD-10-CM | POA: Diagnosis not present

## 2021-07-30 DIAGNOSIS — E559 Vitamin D deficiency, unspecified: Secondary | ICD-10-CM

## 2021-07-30 DIAGNOSIS — Z79899 Other long term (current) drug therapy: Secondary | ICD-10-CM

## 2021-07-30 DIAGNOSIS — G4709 Other insomnia: Secondary | ICD-10-CM

## 2021-07-30 DIAGNOSIS — M79672 Pain in left foot: Secondary | ICD-10-CM | POA: Diagnosis not present

## 2021-07-30 DIAGNOSIS — M79671 Pain in right foot: Secondary | ICD-10-CM

## 2021-07-30 DIAGNOSIS — Z8639 Personal history of other endocrine, nutritional and metabolic disease: Secondary | ICD-10-CM

## 2021-07-30 DIAGNOSIS — A421 Abdominal actinomycosis: Secondary | ICD-10-CM

## 2021-07-30 DIAGNOSIS — M25562 Pain in left knee: Secondary | ICD-10-CM

## 2021-07-30 DIAGNOSIS — F411 Generalized anxiety disorder: Secondary | ICD-10-CM

## 2021-07-30 DIAGNOSIS — R002 Palpitations: Secondary | ICD-10-CM

## 2021-07-30 DIAGNOSIS — Z8719 Personal history of other diseases of the digestive system: Secondary | ICD-10-CM

## 2021-07-30 DIAGNOSIS — I10 Essential (primary) hypertension: Secondary | ICD-10-CM

## 2021-08-02 LAB — HIV ANTIBODY (ROUTINE TESTING W REFLEX): HIV 1&2 Ab, 4th Generation: NONREACTIVE

## 2021-08-02 LAB — ANA: Anti Nuclear Antibody (ANA): POSITIVE — AB

## 2021-08-02 LAB — PROTEIN ELECTROPHORESIS, SERUM, WITH REFLEX
Albumin ELP: 4 g/dL (ref 3.8–4.8)
Alpha 1: 0.3 g/dL (ref 0.2–0.3)
Alpha 2: 0.6 g/dL (ref 0.5–0.9)
Beta 2: 0.3 g/dL (ref 0.2–0.5)
Beta Globulin: 0.4 g/dL (ref 0.4–0.6)
Gamma Globulin: 1.2 g/dL (ref 0.8–1.7)
Total Protein: 6.9 g/dL (ref 6.1–8.1)

## 2021-08-02 LAB — IGG, IGA, IGM
IgG (Immunoglobin G), Serum: 1219 mg/dL (ref 600–1640)
IgM, Serum: 140 mg/dL (ref 50–300)
Immunoglobulin A: 236 mg/dL (ref 47–310)

## 2021-08-02 LAB — CYCLIC CITRUL PEPTIDE ANTIBODY, IGG: Cyclic Citrullin Peptide Ab: 41 UNITS — ABNORMAL HIGH

## 2021-08-02 LAB — CK: Total CK: 59 U/L (ref 29–143)

## 2021-08-02 LAB — SEDIMENTATION RATE: Sed Rate: 2 mm/h (ref 0–20)

## 2021-08-02 LAB — TSH: TSH: 1.28 mIU/L

## 2021-08-02 LAB — ANTI-NUCLEAR AB-TITER (ANA TITER): ANA Titer 1: 1:40 {titer} — ABNORMAL HIGH

## 2021-08-02 LAB — RHEUMATOID FACTOR: Rheumatoid fact SerPl-aCnc: <14 IU/mL (ref <14)

## 2021-08-05 ENCOUNTER — Telehealth: Payer: Self-pay

## 2021-08-05 NOTE — Telephone Encounter (Signed)
Document sent from Cover My Meds to be filled out for pt Humira and were faxed as instructed to [facility] [phone] for Prior authorization.

## 2021-08-06 NOTE — Progress Notes (Signed)
Office Visit Note  Patient: Monica Barker             Date of Birth: Apr 10, 1981           MRN: [id number]             PCP: Zoila Shutter, NP Referring: Zoila Shutter, NP Visit Date: 08/20/2021 Occupation: @GUAROCC @  Subjective:  Pain and stiffness in multiple joints.   History of Present Illness: Monica Barker is a 40 y.o. female with a history of Crohn's disease and joint pain.  She states she continues to have Crohn's flare with abdominal discomfort and loose stools.  She states she has been approved for Humira but she has not started it yet.  She continues to have pain and stiffness in her bilateral shoulders, hands, knees and her feet.  She denies any joint swelling.  Activities of Daily Living:  Patient reports morning stiffness for all day. Patient Reports nocturnal pain.  Difficulty dressing/grooming: Denies Difficulty climbing stairs: Denies Difficulty getting out of chair: Denies Difficulty using hands for taps, buttons, cutlery, and/or writing: Denies  Review of Systems  Constitutional:  Positive for fatigue.  HENT:  Positive for mouth sores and mouth dryness. Negative for nose dryness.   Eyes:  Negative for pain, itching and dryness.  Respiratory:  Positive for shortness of breath. Negative for difficulty breathing.   Cardiovascular:  Positive for palpitations. Negative for chest pain.  Gastrointestinal:  Positive for constipation and diarrhea. Negative for blood in stool.  Endocrine: Negative for increased urination.  Genitourinary:  Negative for difficulty urinating.  Musculoskeletal:  Positive for joint pain, joint pain and morning stiffness. Negative for joint swelling, myalgias, muscle tenderness and myalgias.  Skin:  Negative for color change, rash, redness and sensitivity to sunlight.  Allergic/Immunologic: Negative for susceptible to infections.  Neurological:  Positive for memory loss and weakness. Negative for dizziness, numbness and headaches.   Hematological:  Negative for bruising/bleeding tendency.  Psychiatric/Behavioral:  Positive for depressed mood, confusion and sleep disturbance. The patient is nervous/anxious.    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/2021   Essential hypertension 08/20/2021   History of hyperlipidemia 08/20/2021   History of type 2 diabetes mellitus 08/20/2021   History of hypothyroidism 08/20/2021   Chronic fatigue syndrome 08/20/2021   Vitamin D deficiency 08/20/2021   Vitamin B12 deficiency 08/20/2021   GAD (generalized anxiety disorder) 08/20/2021   Other insomnia 08/20/2021     Past Medical History:  Diagnosis Date   ADHD (attention deficit hyperactivity disorder)    Bipolar disorder ([place])    COVID-19 virus infection    11/22/2019 tested positive   Crohn disease ([place])    Dx 2005 at age 71 s/p R hemicolectomy, with postoperative recurrance, moderate activity on colonoscopy 08/08/2015   Depressive disorder    Dysphagia    Dysthymic disorder    IBS (irritable bowel syndrome)    IBS (irritable bowel syndrome)    with diarrhea   Skin cancer    TMJ (temporomandibular joint disorder)     Family History  Problem Relation Age of Onset   Skin cancer Father    COPD Father    Gout Father    Arthritis Father    Hypertension Father    Diabetes  Brother    Drug abuse Brother    Colon cancer Paternal Aunt        Paternal mom's sister/PGM's sister   Breast cancer Maternal Grandmother        happened twice/x2   Skin cancer Paternal Grandmother    Endometriosis Sister    ADD / ADHD Son    Healthy Son    Esophageal cancer Neg Hx    Past Surgical History:  Procedure Laterality Date   APPENDECTOMY     CHOLECYSTECTOMY  2014   COLON SURGERY   05/2004   due to [place]  05/2004   COLONOSCOPY  09/02/2017   Recurrance of Crohn's disease in the neo terminal ileum (biopsied)- mild. Minimal rectal erythema (? importance-biopsed). No evidence of peranal Crohn's disease. Status post ileocolectomy.    COLONOSCOPY  09/02/2017   Recurrance of Crohn's Disease in the neo terminal ileum (biopsed)-mild. Minimal rectal erythema (? Importance-biposed) No Evidence of peranal crohn's disease. Status post ileccolectomy.   ESOPHAGOGASTRODUODENOSCOPY  10/03/2013   Mild gastritis. Normal EGD   ESOPHAGOGASTRODUODENOSCOPY  10/03/2013   Mild Gastritis. Normal EGD   Social History   Social History Narrative   ** Merged History Encounter **       Immunization History  Administered Date(s) Administered   Hep A / Hep B 03/13/2021, 04/16/2021     Objective: Vital Signs: BP 117/82 (BP Location: Left Arm, Patient Position: Sitting, Cuff Size: Normal)   Pulse 80   Ht 5' 3"  (1.6 m)   Wt 162 lb 3.2 oz (73.6 kg)   BMI 28.73 kg/m    Physical Exam Vitals and nursing note reviewed.  Constitutional:      Appearance: She is well-developed.  HENT:     Head: Normocephalic and atraumatic.  Eyes:     Conjunctiva/sclera: Conjunctivae normal.  Cardiovascular:     Rate and Rhythm: Normal rate and regular rhythm.     Heart sounds: Normal heart sounds.  Pulmonary:     Effort: Pulmonary effort is normal.     Breath sounds: Normal breath sounds.  Abdominal:     General: Bowel sounds are normal.     Palpations: Abdomen is soft.  Musculoskeletal:     Cervical back: Normal range of motion.  Lymphadenopathy:     Cervical: No cervical adenopathy.  Skin:    General: Skin is warm and dry.     Capillary Refill: Capillary refill takes less than 2 seconds.  Neurological:     Mental Status: She is alert and oriented to person, place, and time.  Psychiatric:        Behavior: Behavior normal.     Musculoskeletal Exam: C-spine with good range of  motion.  Shoulder joints, elbow joints, wrist joints with good range of motion.  She has stiffness with range of motion of her shoulders.  She had bilateral PIP and DIP thickening but no synovitis was noted.  Hip joints and knee joints with good range of motion.  She has crepitus in her left knee joint.  Ankles and MTPs are in good range of motion.  CDAI Exam: CDAI Score: -- Patient Global: --; Provider Global: -- Swollen: --; Tender: -- Joint Exam 08/20/2021   No joint exam has been documented for this visit   There is currently no information documented on the homunculus. Go to the Rheumatology activity and complete the homunculus joint exam.  Investigation: No additional findings.  Imaging: XR Foot 2 Views Left  Result Date: 07/30/2021  No MTP, PIP or DIP narrowing was noted.  No intertarsal, tibiotalar or subtalar joint space narrowing was noted.  No erosive changes were noted. Impression: Unremarkable x-rays of the foot.  XR Foot 2 Views Right  Result Date: 07/30/2021 No MTP, PIP or DIP narrowing was noted.  No intertarsal, tibiotalar or subtalar joint space narrowing was noted.  No erosive changes were noted. Impression: Unremarkable x-rays of the foot.  XR Hand 2 View Left  Result Date: 07/30/2021 Minimal PIP narrowing was noted.  No MCP, intercarpal or radiocarpal joint space narrowing was noted.  No DIP narrowing was noted.  No erosive changes were noted. Impression: X-rays are consistent with early osteoarthritis.  XR Hand 2 View Right  Result Date: 07/30/2021 Minimal PIP narrowing was noted.  No MCP, intercarpal or radiocarpal joint space narrowing was noted.  No DIP narrowing was noted.  No erosive changes were noted. Impression: X-rays are consistent with early osteoarthritis.  XR KNEE 3 VIEW LEFT  Result Date: 07/30/2021 No medial or lateral compartment narrowing was noted.  Mild patellofemoral narrowing was noted.  No chondrocalcinosis was noted. Impression: These  findings are consistent with mild chondromalacia patella.  XR KNEE 3 VIEW RIGHT  Result Date: 07/30/2021 No medial or lateral compartment narrowing was noted.  No chondrocalcinosis was noted.  No patellofemoral narrowing was noted. Impression: Unremarkable x-ray of the knee.  XR Pelvis 1-2 Views  Result Date: 07/30/2021 No SI joints sclerosis or narrowing was noted.  No narrowing of the hip joints was noted. Impression: Unremarkable x-rays of the hip joints and SI joints.  XR Shoulder Left  Result Date: 07/30/2021 No glenohumeral or acromioclavicular joint space narrowing was noted.  No chondrocalcinosis was noted. Impression: Unremarkable x-ray of the shoulder.  XR Shoulder Right  Result Date: 07/30/2021 No glenohumeral or acromioclavicular joint space narrowing was noted.  No chondrocalcinosis was noted. Impression: Unremarkable x-ray of the shoulder.   Recent Labs: Lab Results  Component Value Date   WBC 6.8 08/19/2021   HGB 12.3 08/19/2021   PLT 290.0 08/19/2021   NA 141 08/19/2021   K 4.1 08/19/2021   CL 107 08/19/2021   CO2 28 08/19/2021   GLUCOSE 80 08/19/2021   BUN 7 08/19/2021   CREATININE 0.72 08/19/2021   BILITOT 0.3 08/19/2021   ALKPHOS 45 08/19/2021   AST 12 08/19/2021   ALT 10 08/19/2021   PROT 6.7 08/19/2021   ALBUMIN 4.2 08/19/2021   CALCIUM 8.9 08/19/2021   GFRAA  05/14/2008    >60        The eGFR has been calculated using the MDRD equation. This calculation has not been validated in all clinical   QFTBGOLDPLUS NEGATIVE 03/01/2021   July 30, 2021 SPEP normal, immunoglobulins normal, CK 59, ESR 2, TSH normal, RF negative, anti-CCP 41, ANA +1: 40 speckled, HIV negative   Speciality Comments: No specialty comments available.  Procedures:  No procedures performed Allergies: Ativan [lorazepam], Latex, and Latex   Assessment / Plan:     Visit Diagnoses: Chronic pain of both shoulders - X-rays were unremarkable.  She x-rays findings were  discussed with the patient.  RF negative, anti-CCP positive.  Anti-CCP antibodies moderate positive although she does not have any clinical features of rheumatoid arthritis.  Significance of positive anti-CCP antibody was discussed.  She has some stiffness in her shoulders.  She had good range of motion of bilateral shoulders.  A handout on shoulder joint exercises was given.  Primary osteoarthritis of both hands -  Clinical and radiographic findings are consistent with osteoarthritis.  She discomfort and injection deficit she will undergo back. The x-rays findings were discussed with the patient.  A handout on hand muscle strengthening exercises was given.  Chondromalacia patellae, left knee - History of pain in bilateral knee joints.  The x-rays were unremarkable except for mild chondromalacia patella of the left knee joint.  The x-rays findings were discussed with the patient.  Primary osteoarthritis of both feet-she continues to have some stiffness in her feet.  No synovitis was noted.  History of Crohn's disease-she is followed by Dr. Lyndel Safe.  She states Humira is approved and she is waiting on the prescription to start on the medication.  Per her request indication side effects contraindications of Humira were discussed with the patient.  She was advised to get annual skin examination to screen for nonmelanoma skin cancer while she is on Humira.  She is also advised to stop Humira in case she develops an infection and restart once infection resolves.  Updated information regarding realization was also placed in the AVS.  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  Other medical problems are listed as follows:  History of hyperlipidemia  Palpitations  History of type 2 diabetes mellitus  History of hypothyroidism  Chronic fatigue syndrome  Other insomnia  GAD (generalized anxiety disorder)  Vitamin D deficiency  Vitamin  B12 deficiency  Orders: No orders of the defined types were placed in this encounter.  No orders of the defined types were placed in this encounter.  .  Follow-Up Instructions: Return in about 3 months (around 11/20/2021) for Osteoarthritis, +CCP Ab, Crohn's disease.   Bo Merino, MD  Note - This record has been created using Editor, commissioning.  Chart creation errors have been sought, but may not always  have been located. Such creation errors do not reflect on  the standard of medical care.

## 2021-08-09 ENCOUNTER — Telehealth: Payer: Self-pay

## 2021-08-09 NOTE — Telephone Encounter (Signed)
Pt was notified of the approval of the Humira starter Kit and that [facility] [phone])  will be reaching out to her for shipment. Pt verbalized  understanding with all questions answered.

## 2021-08-13 ENCOUNTER — Telehealth: Payer: Self-pay

## 2021-08-13 NOTE — Telephone Encounter (Signed)
[facility] was contacted to Ensure that the Pt Humina 40 mg pen  maintenance has been approved for the PA and that every thing is set for the pt to receive the medications without any issues. Monique from [place] ensured me that pt had PA and that there was nothing else that we or the pt needed to do. Pt notified of the previous conversation that was  with [facility]. Pt expressed appreciation.

## 2021-08-19 ENCOUNTER — Ambulatory Visit (INDEPENDENT_AMBULATORY_CARE_PROVIDER_SITE_OTHER): Payer: BC Managed Care – PPO | Admitting: Gastroenterology

## 2021-08-19 ENCOUNTER — Other Ambulatory Visit (INDEPENDENT_AMBULATORY_CARE_PROVIDER_SITE_OTHER): Payer: BC Managed Care – PPO

## 2021-08-19 ENCOUNTER — Encounter: Payer: Self-pay | Admitting: Gastroenterology

## 2021-08-19 ENCOUNTER — Other Ambulatory Visit: Payer: Self-pay

## 2021-08-19 VITALS — BP 128/68 | HR 65 | Ht 63.0 in | Wt 161.5 lb

## 2021-08-19 DIAGNOSIS — E538 Deficiency of other specified B group vitamins: Secondary | ICD-10-CM

## 2021-08-19 DIAGNOSIS — R110 Nausea: Secondary | ICD-10-CM

## 2021-08-19 DIAGNOSIS — K50919 Crohn's disease, unspecified, with unspecified complications: Secondary | ICD-10-CM

## 2021-08-19 NOTE — Patient Instructions (Signed)
If you are age 37 or older, your body mass index should be between 23-30. Your Body mass index is 28.61 kg/m. If this is out of the aforementioned range listed, please consider follow up with your Primary Care Provider.  If you are age 9 or younger, your body mass index should be between 19-25. Your Body mass index is 28.61 kg/m. If this is out of the aformentioned range listed, please consider follow up with your Primary Care Provider.   __________________________________________________________  The [facility] providers would like to encourage you to use [facility] to communicate with providers for non-urgent requests or questions.  Due to long hold times on the telephone, sending your provider a message by [facility] may be a faster and more efficient way to get a response.  Please allow 48 business hours for a response.  Please remember that this is for non-urgent requests.   Please go to the lab on the 2nd floor [address] before you leave the office today.   You will receive a call from central scheduling to schedule your cat scan.  You will also receive a call from our office once we get your Humira approved.  Due to recent changes in healthcare laws, you may see the results of your imaging and laboratory studies on MyChart before your provider has had a chance to review them.  We understand that in some cases there may be results that are confusing or concerning to you. Not all laboratory results come back in the same time frame and the provider may be waiting for multiple results in order to interpret others.  Please give [place] 48 hours in order for your provider to thoroughly review all the results before contacting the office for clarification of your results.   It was a pleasure to see you today!  Jackquline Denmark, M.D.

## 2021-08-19 NOTE — Progress Notes (Signed)
Chief Complaint: FU  Referring Provider:  Zoila Shutter, NP      ASSESSMENT AND PLAN;   #1.  Crohn's disease with anastomotic recurrence on CT 12/2019.    Dx 2005 at age 40 s/p R ileocolectomy (2005), with postoperative recurrance, moderate activity on colon 07/2015. Refused any meds for Crohn's d/t S/Es.  Self controlled on CBD oil.  Has associated IBS with diarrhea and element of postcholecystectomy diarrhea. Neg stool for GI pathogen, C diff 03/2021 with elevated calprotectin. Nl CRP. Neg TMPT 08/2021. S/P hep B vaccine.  #2.  Associated B12 deficiency. Last B12 was normal.  #3. nausea  Plan: - Start Humira Day 1- 126m, day 15 - 811m then 4025mQ every other week, starting day 29. SteRemo Lippskes - CTE - CBC with diff, CMP, CRP, sed rate. - Stool for GI pathogen and calprotectin. - Hold off on steroids at this time. - She has appt with Dr S. Chauncey Cruelevashwar tomorrow  HPI:    DanDaphine Barker a 40 14o. female   For FU visit.  5 looser BMs today since 6 AM- watery, after eating. Has been having nocturnal symptoms. Getting worse. No Blood. With mucus. Yellow. Left mid abdo pain with cramps. Has nausea but no vomiting. "Out of work" x 9 weeks.  Wants us [place] fill the  FMLA forms. She will send those.  Has appt with Dr DewTheressa Stampsmorrow  Has gained wt.   Wt Readings from Last 3 Encounters:  08/19/21 161 lb 8 oz (73.3 kg)  07/30/21 162 lb 12.8 oz (73.8 kg)  04/18/21 145 lb (65.8 kg)   No NSAIDs  Had "Flu" last week. Everybody in sis's wedding had flu.  Refused all medications for Crohn's disease in the past. As most of the medicines make her "crazy".  Willing to try Humira now.  It has been approved by ins[facility] HealthFrom previous labs: -CBC, CMP, B12, C-reactive protein was normal.   Past GI procedures Colonoscopy 04/18/2021 - Moderate to severe neoterminal ileitis, consistent with postoperative Crohn's recurrence. Bx- C/W ileitis. Neg random colon biopsies -  Diminutive colonic polyp s/p polypectomy. Neg Bx - No colonic or perianal involvement.  EGD 04/18/2021 - Minimal gastritis. No evidence of upper GI Crohn's disease. Bx- neg for HP - SB bx were negative for celiac disease.  CT AP 12/2019 Redemonstrated postoperative findings of terminal ileocolectomy and reanastomosis. There is new inflammatory bowel wall thickening of the most distal remaining 10 cm of ileum at the anastomosis. No evidence of complicating fistula, abscess, or stricture. No evident inflammatory involvement of the colon or rectum.    Past Medical History:  Diagnosis Date   ADHD (attention deficit hyperactivity disorder)    [place]  [place]  Dx 2005 at age 40 4p R hemicolectomy, with postoperative recurrance, moderate activity on colonoscopy 08/08/2015   Depressive disorder    Dysphagia    Dysthymic disorder    IBS (irritable bowel syndrome)    IBS (irritable bowel syndrome)    with diarrhea   Skin cancer    TMJ (temporomandibular joint disorder)     Past Surgical History:  Procedure Laterality Date   APPENDECTOMY     CHOLECYSTECTOMY  2014   [place]   COLONOSCOPY  09/02/2017   Recurrance of Crohn's disease in the neo terminal ileum (biopsied)- mild. Minimal rectal erythema (? importance-biopsed).  No evidence of peranal Crohn's disease. Status post ileocolectomy.    COLONOSCOPY  09/02/2017   Recurrance of Crohn's Disease in the neo terminal ileum (biopsed)-mild. Minimal rectal erythema (? Importance-biposed) No Evidence of peranal crohn's disease. Status post ileccolectomy.   ESOPHAGOGASTRODUODENOSCOPY  10/03/2013   Mild gastritis. Normal EGD   ESOPHAGOGASTRODUODENOSCOPY  10/03/2013   Mild Gastritis. Normal EGD    Family History  Problem Relation Age of Onset   Skin cancer Father    COPD Father    Gout Father    Arthritis Father    Hypertension Father    Diabetes  Brother    Drug abuse Brother    Colon cancer Paternal Aunt        Paternal mom's sister/PGM's sister   Breast cancer Maternal Grandmother        happened twice/x2   Skin cancer Paternal Grandmother    Endometriosis Sister    ADD / ADHD Son    Healthy Son    Esophageal cancer Neg Hx     Social History   Tobacco Use   Smoking status: Former   Smokeless tobacco: Never   Tobacco comments:    09/2019  Vaping Use   Vaping Use: Former   Devices: occasionally   Substance Use Topics   Alcohol use: Not Currently   Drug use: Not Currently    Current Outpatient Medications  Medication Sig Dispense Refill   Adalimumab (HUMIRA PEN) 40 MG/0.4ML PNKT Starting day 29 inject SQ 40 mg and repeat every 14 days 2 each 6   Adalimumab (HUMIRA PEN-CD/UC/HS STARTER) 80 MG/0.8ML PNKT Inject SQ 160 mg day 1 then inject 80 mg SQ day 15 then begin maintenance dose 1 each 0   ALPRAZolam (XANAX) 0.5 MG tablet Take 0.5 mg by mouth 3 (three) times daily as needed for anxiety.     budesonide (ENTOCORT EC) 3 MG 24 hr capsule Take 3 capsules (9 mg total) by mouth daily. Please take 41m daily for 4 weeks. (Patient not taking: Reported on 04/18/2021) 90 capsule 2   cyanocobalamin (,VITAMIN B-12,) 1000 MCG/ML injection INJECT 1 ML (1,000 MCG TOTAL) INTO THE MUSCLE EVERY 30 (THIRTY) DAYS. 1 mL 12   Multiple Vitamin (MULTIVITAMIN) capsule Take 1 capsule by mouth daily.     pantoprazole (PROTONIX) 40 MG tablet Take 1 tablet (40 mg total) by mouth daily. 90 tablet 3   predniSONE (DELTASONE) 10 MG tablet 4 10 mg tablets daily x two weeks. 3 10 mg tablets per day x 2 weeks. 2  127mtablets per day x 2 weeks. 168 tablet 0   No current facility-administered medications for this visit.    Allergies  Allergen Reactions   Ativan [Lorazepam]     intesitided her anxiety    Latex    Latex Other (See Comments)    unknown    Review of Systems:  neg     Physical Exam:    BP 128/68   Pulse 65   Ht 5' 3"  (1.6 m)    Wt 161 lb 8 oz (73.3 kg)   SpO2 100%   BMI 28.61 kg/m  Filed Weights   08/19/21 1359  Weight: 161 lb 8 oz (73.3 kg)   Gen: awake, alert, NAD HEENT: anicteric, no pallor CV: RRR, no mrg Pulm: CTA b/l Abd: soft, NT/ND, +BS throughout Ext: no c/c/e Neuro: nonfocal   Data Reviewed: I have personally reviewed following labs and imaging studies  CBC: CBC Latest Ref Rng & Units 03/01/2021 12/27/2019  05/14/2008  WBC 4.0 - 10.5 K/uL 7.5 6.1 8.2  Hemoglobin 12.0 - 15.0 g/dL 12.1 13.4 13.9  Hematocrit 36.0 - 46.0 % 36.7 40.2 41.2  Platelets 150.0 - 400.0 K/uL 304.0 272.0 254    CMP: CMP Latest Ref Rng & Units 07/30/2021 03/01/2021 12/27/2019  Glucose 70 - 99 mg/dL - 89 84  BUN 6 - 23 mg/dL - 9 6  Creatinine 0.40 - 1.20 mg/dL - 0.71 0.73  Sodium 135 - 145 mEq/L - 139 137  Potassium 3.5 - 5.1 mEq/L - 3.9 3.9  Chloride 96 - 112 mEq/L - 104 106  CO2 19 - 32 mEq/L - 28 26  Calcium 8.4 - 10.5 mg/dL - 8.7 9.4  Total Protein 6.1 - 8.1 g/dL 6.9 6.7 7.3  Total Bilirubin 0.2 - 1.2 mg/dL - 0.2 0.5  Alkaline Phos 39 - 117 U/L - 54 45  AST 0 - 37 U/L - 12 13  ALT 0 - 35 U/L - 12 10        Carmell Austria, MD 08/19/2021, 2:04 PM  Cc: Zoila Shutter, NP

## 2021-08-20 ENCOUNTER — Ambulatory Visit (INDEPENDENT_AMBULATORY_CARE_PROVIDER_SITE_OTHER): Payer: BC Managed Care – PPO | Admitting: Rheumatology

## 2021-08-20 ENCOUNTER — Encounter: Payer: Self-pay | Admitting: Rheumatology

## 2021-08-20 VITALS — BP 117/82 | HR 80 | Ht 63.0 in | Wt 162.2 lb

## 2021-08-20 DIAGNOSIS — G4700 Insomnia, unspecified: Secondary | ICD-10-CM

## 2021-08-20 DIAGNOSIS — I10 Essential (primary) hypertension: Secondary | ICD-10-CM

## 2021-08-20 DIAGNOSIS — F411 Generalized anxiety disorder: Secondary | ICD-10-CM

## 2021-08-20 DIAGNOSIS — G9332 Myalgic encephalomyelitis/chronic fatigue syndrome: Secondary | ICD-10-CM

## 2021-08-20 DIAGNOSIS — E538 Deficiency of other specified B group vitamins: Secondary | ICD-10-CM

## 2021-08-20 DIAGNOSIS — G4709 Other insomnia: Secondary | ICD-10-CM

## 2021-08-20 DIAGNOSIS — M19041 Primary osteoarthritis, right hand: Secondary | ICD-10-CM

## 2021-08-20 DIAGNOSIS — Z8639 Personal history of other endocrine, nutritional and metabolic disease: Secondary | ICD-10-CM | POA: Insufficient documentation

## 2021-08-20 DIAGNOSIS — M25512 Pain in left shoulder: Secondary | ICD-10-CM

## 2021-08-20 DIAGNOSIS — Z8719 Personal history of other diseases of the digestive system: Secondary | ICD-10-CM

## 2021-08-20 DIAGNOSIS — M19072 Primary osteoarthritis, left ankle and foot: Secondary | ICD-10-CM

## 2021-08-20 DIAGNOSIS — M2242 Chondromalacia patellae, left knee: Secondary | ICD-10-CM

## 2021-08-20 DIAGNOSIS — G8929 Other chronic pain: Secondary | ICD-10-CM

## 2021-08-20 DIAGNOSIS — R002 Palpitations: Secondary | ICD-10-CM

## 2021-08-20 DIAGNOSIS — M25511 Pain in right shoulder: Secondary | ICD-10-CM

## 2021-08-20 DIAGNOSIS — M19071 Primary osteoarthritis, right ankle and foot: Secondary | ICD-10-CM

## 2021-08-20 DIAGNOSIS — M19042 Primary osteoarthritis, left hand: Secondary | ICD-10-CM

## 2021-08-20 DIAGNOSIS — A421 Abdominal actinomycosis: Secondary | ICD-10-CM | POA: Insufficient documentation

## 2021-08-20 DIAGNOSIS — M2241 Chondromalacia patellae, right knee: Secondary | ICD-10-CM

## 2021-08-20 DIAGNOSIS — E559 Vitamin D deficiency, unspecified: Secondary | ICD-10-CM

## 2021-08-20 HISTORY — DX: Primary osteoarthritis, right ankle and foot: M19.071

## 2021-08-20 HISTORY — DX: Generalized anxiety disorder: F41.1

## 2021-08-20 HISTORY — DX: Deficiency of other specified B group vitamins: E53.8

## 2021-08-20 HISTORY — DX: Vitamin D deficiency, unspecified: E55.9

## 2021-08-20 HISTORY — DX: Abdominal actinomycosis: A42.1

## 2021-08-20 HISTORY — DX: Myalgic encephalomyelitis/chronic fatigue syndrome: G93.32

## 2021-08-20 HISTORY — DX: Essential (primary) hypertension: I10

## 2021-08-20 HISTORY — DX: Insomnia, unspecified: G47.00

## 2021-08-20 HISTORY — DX: Personal history of other endocrine, nutritional and metabolic disease: Z86.39

## 2021-08-20 HISTORY — DX: Personal history of other diseases of the digestive system: Z87.19

## 2021-08-20 HISTORY — DX: Primary osteoarthritis, left hand: M19.041

## 2021-08-20 HISTORY — DX: Chondromalacia patellae, left knee: M22.42

## 2021-08-20 HISTORY — DX: Primary osteoarthritis, left ankle and foot: M19.072

## 2021-08-20 LAB — CBC WITH DIFFERENTIAL/PLATELET
Basophils Absolute: 0 10*3/uL (ref 0.0–0.1)
Basophils Relative: 0.6 % (ref 0.0–3.0)
Eosinophils Absolute: 0 10*3/uL (ref 0.0–0.7)
Eosinophils Relative: 0.3 % (ref 0.0–5.0)
HCT: 38.1 % (ref 36.0–46.0)
Hemoglobin: 12.3 g/dL (ref 12.0–15.0)
Lymphocytes Relative: 35.6 % (ref 12.0–46.0)
Lymphs Abs: 2.4 10*3/uL (ref 0.7–4.0)
MCHC: 32.2 g/dL (ref 30.0–36.0)
MCV: 85 fl (ref 78.0–100.0)
Monocytes Absolute: 0.4 10*3/uL (ref 0.1–1.0)
Monocytes Relative: 6 % (ref 3.0–12.0)
Neutro Abs: 3.9 10*3/uL (ref 1.4–7.7)
Neutrophils Relative %: 57.5 % (ref 43.0–77.0)
Platelets: 290 10*3/uL (ref 150.0–400.0)
RBC: 4.48 Mil/uL (ref 3.87–5.11)
RDW: 13.5 % (ref 11.5–15.5)
WBC: 6.8 10*3/uL (ref 4.0–10.5)

## 2021-08-20 LAB — COMPREHENSIVE METABOLIC PANEL
ALT: 10 U/L (ref 0–35)
AST: 12 U/L (ref 0–37)
Albumin: 4.2 g/dL (ref 3.5–5.2)
Alkaline Phosphatase: 45 U/L (ref 39–117)
BUN: 7 mg/dL (ref 6–23)
CO2: 28 mEq/L (ref 19–32)
Calcium: 8.9 mg/dL (ref 8.4–10.5)
Chloride: 107 mEq/L (ref 96–112)
Creatinine, Ser: 0.72 mg/dL (ref 0.40–1.20)
GFR: 106.19 mL/min (ref >60.00)
Glucose, Bld: 80 mg/dL (ref 70–99)
Potassium: 4.1 mEq/L (ref 3.5–5.1)
Sodium: 141 mEq/L (ref 135–145)
Total Bilirubin: 0.3 mg/dL (ref 0.2–1.2)
Total Protein: 6.7 g/dL (ref 6.0–8.3)

## 2021-08-20 LAB — C-REACTIVE PROTEIN: CRP: <1 mg/dL (ref 0.5–20.0)

## 2021-08-20 NOTE — Patient Instructions (Signed)
Hand Exercises Hand exercises can be helpful for almost anyone. These exercises can strengthen the hands, improve flexibility and movement, and increase blood flow to the hands. These results can make work and daily tasks easier. Hand exercises can be especially helpful for people who have joint pain from arthritis or have nerve damage from overuse (carpal tunnel syndrome). These exercises can also help people who have injured a hand. Exercises Most of these hand exercises are gentle stretching and motion exercises. It is usually safe to do them often throughout the day. Warming up your hands before exercise may help to reduce stiffness. You can do this with gentle massage or by placing your hands in warm water for 10-15 minutes. It is normal to feel some stretching, pulling, tightness, or mild discomfort as you begin new exercises. This will gradually improve. Stop an exercise right away if you feel sudden, severe pain or your pain gets worse. Ask your health care provider which exercises are best for you. Knuckle bend or "claw" fist  Stand or sit with your arm, hand, and all five fingers pointed straight up. Make sure to keep your wrist straight during the exercise. Gently bend your fingers down toward your palm until the tips of your fingers are touching the top of your palm. Keep your big knuckle straight and just bend the small knuckles in your fingers. Hold this position for __________ seconds. Straighten (extend) your fingers back to the starting position. Repeat this exercise 5-10 times with each hand. Full finger fist  Stand or sit with your arm, hand, and all five fingers pointed straight up. Make sure to keep your wrist straight during the exercise. Gently bend your fingers into your palm until the tips of your fingers are touching the middle of your palm. Hold this position for __________ seconds. Extend your fingers back to the starting position, stretching every joint fully. Repeat  this exercise 5-10 times with each hand. Straight fist Stand or sit with your arm, hand, and all five fingers pointed straight up. Make sure to keep your wrist straight during the exercise. Gently bend your fingers at the big knuckle, where your fingers meet your hand, and the middle knuckle. Keep the knuckle at the tips of your fingers straight and try to touch the bottom of your palm. Hold this position for __________ seconds. Extend your fingers back to the starting position, stretching every joint fully. Repeat this exercise 5-10 times with each hand. Tabletop  Stand or sit with your arm, hand, and all five fingers pointed straight up. Make sure to keep your wrist straight during the exercise. Gently bend your fingers at the big knuckle, where your fingers meet your hand, as far down as you can while keeping the small knuckles in your fingers straight. Think of forming a tabletop with your fingers. Hold this position for __________ seconds. Extend your fingers back to the starting position, stretching every joint fully. Repeat this exercise 5-10 times with each hand. Finger spread  Place your hand flat on a table with your palm facing down. Make sure your wrist stays straight as you do this exercise. Spread your fingers and thumb apart from each other as far as you can until you feel a gentle stretch. Hold this position for __________ seconds. Bring your fingers and thumb tight together again. Hold this position for __________ seconds. Repeat this exercise 5-10 times with each hand. Making circles  Stand or sit with your arm, hand, and all five fingers pointed  straight up. Make sure to keep your wrist straight during the exercise. Make a circle by touching the tip of your thumb to the tip of your index finger. Hold for __________ seconds. Then open your hand wide. Repeat this motion with your thumb and each finger on your hand. Repeat this exercise 5-10 times with each hand. Thumb  motion  Sit with your forearm resting on a table and your wrist straight. Your thumb should be facing up toward the ceiling. Keep your fingers relaxed as you move your thumb. Lift your thumb up as high as you can toward the ceiling. Hold for __________ seconds. Bend your thumb across your palm as far as you can, reaching the tip of your thumb for the small finger (pinkie) side of your palm. Hold for __________ seconds. Repeat this exercise 5-10 times with each hand. Grip strengthening  Hold a stress ball or other soft ball in the middle of your hand. Slowly increase the pressure, squeezing the ball as much as you can without causing pain. Think of bringing the tips of your fingers into the middle of your palm. All of your finger joints should bend when doing this exercise. Hold your squeeze for __________ seconds, then relax. Repeat this exercise 5-10 times with each hand. Contact a health care provider if: Your hand pain or discomfort gets much worse when you do an exercise. Your hand pain or discomfort does not improve within 2 hours after you exercise. If you have any of these problems, stop doing these exercises right away. Do not do them again unless your health care provider says that you can. Get help right away if: You develop sudden, severe hand pain or swelling. If this happens, stop doing these exercises right away. Do not do them again unless your health care provider says that you can. This information is not intended to replace advice given to you by your health care provider. Make sure you discuss any questions you have with your health care provider. Document Revised: 01/24/2021 Document Reviewed: 01/24/2021 [employer] Patient Education  [place]. Shoulder Exercises Ask your health care provider which exercises are safe for you. Do exercises exactly as told by your health care provider and adjust them as directed. It is normal to feel mild stretching, pulling, tightness,  or discomfort as you do these exercises. Stop right away if you feel sudden pain or your pain gets worse. Do not begin these exercises until told by your health care provider. Stretching exercises External rotation and abduction This exercise is sometimes called corner stretch. This exercise rotates your arm outward (external rotation) and moves your arm out from your body (abduction). Stand in a doorway with one of your feet slightly in front of the other. This is called a staggered stance. If you cannot reach your forearms to the door frame, stand facing a corner of a room. Choose one of the following positions as told by your health care provider: Place your hands and forearms on the door frame above your head. Place your hands and forearms on the door frame at the height of your head. Place your hands on the door frame at the height of your elbows. Slowly move your weight onto your front foot until you feel a stretch across your chest and in the front of your shoulders. Keep your head and chest upright and keep your abdominal muscles tight. Hold for __________ seconds. To release the stretch, shift your weight to your back foot. Repeat __________  times. Complete this exercise __________ times a day. Extension, standing Stand and hold a broomstick, a cane, or a similar object behind your back. Your hands should be a little wider than shoulder width apart. Your palms should face away from your back. Keeping your elbows straight and your shoulder muscles relaxed, move the stick away from your body until you feel a stretch in your shoulders (extension). Avoid shrugging your shoulders while you move the stick. Keep your shoulder blades tucked down toward the middle of your back. Hold for __________ seconds. Slowly return to the starting position. Repeat __________ times. Complete this exercise __________ times a day. Range-of-motion exercises Pendulum  Stand near a wall or a surface that you  can hold onto for balance. Bend at the waist and let your left / right arm hang straight down. Use your other arm to support you. Keep your back straight and do not lock your knees. Relax your left / right arm and shoulder muscles, and move your hips and your trunk so your left / right arm swings freely. Your arm should swing because of the motion of your body, not because you are using your arm or shoulder muscles. Keep moving your hips and trunk so your arm swings in the following directions, as told by your health care provider: Side to side. Forward and backward. In clockwise and counterclockwise circles. Continue each motion for __________ seconds, or for as long as told by your health care provider. Slowly return to the starting position. Repeat __________ times. Complete this exercise __________ times a day. Shoulder flexion, standing  Stand and hold a broomstick, a cane, or a similar object. Place your hands a little more than shoulder width apart on the object. Your left / right hand should be palm up, and your other hand should be palm down. Keep your elbow straight and your shoulder muscles relaxed. Push the stick up with your healthy arm to raise your left / right arm in front of your body, and then over your head until you feel a stretch in your shoulder (flexion). Avoid shrugging your shoulder while you raise your arm. Keep your shoulder blade tucked down toward the middle of your back. Hold for __________ seconds. Slowly return to the starting position. Repeat __________ times. Complete this exercise __________ times a day. Shoulder abduction, standing Stand and hold a broomstick, a cane, or a similar object. Place your hands a little more than shoulder width apart on the object. Your left / right hand should be palm up, and your other hand should be palm down. Keep your elbow straight and your shoulder muscles relaxed. Push the object across your body toward your left / right  side. Raise your left / right arm to the side of your body (abduction) until you feel a stretch in your shoulder. Do not raise your arm above shoulder height unless your health care provider tells you to do that. If directed, raise your arm over your head. Avoid shrugging your shoulder while you raise your arm. Keep your shoulder blade tucked down toward the middle of your back. Hold for __________ seconds. Slowly return to the starting position. Repeat __________ times. Complete this exercise __________ times a day. Internal rotation  Place your left / right hand behind your back, palm up. Use your other hand to dangle an exercise band, a towel, or a similar object over your shoulder. Grasp the band with your left / right hand so you are holding on to both  ends. Gently pull up on the band until you feel a stretch in the front of your left / right shoulder. The movement of your arm toward the center of your body is called internal rotation. Avoid shrugging your shoulder while you raise your arm. Keep your shoulder blade tucked down toward the middle of your back. Hold for __________ seconds. Release the stretch by letting go of the band and lowering your hands. Repeat __________ times. Complete this exercise __________ times a day. Strengthening exercises External rotation  Sit in a stable chair without armrests. Secure an exercise band to a stable object at elbow height on your left / right side. Place a soft object, such as a folded towel or a small pillow, between your left / right upper arm and your body to move your elbow about 4 inches (10 cm) away from your side. Hold the end of the exercise band so it is tight and there is no slack. Keeping your elbow pressed against the soft object, slowly move your forearm out, away from your abdomen (external rotation). Keep your body steady so only your forearm moves. Hold for __________ seconds. Slowly return to the starting position. Repeat  __________ times. Complete this exercise __________ times a day. Shoulder abduction  Sit in a stable chair without armrests, or stand up. Hold a __________ weight in your left / right hand, or hold an exercise band with both hands. Start with your arms straight down and your left / right palm facing in, toward your body. Slowly lift your left / right hand out to your side (abduction). Do not lift your hand above shoulder height unless your health care provider tells you that this is safe. Keep your arms straight. Avoid shrugging your shoulder while you do this movement. Keep your shoulder blade tucked down toward the middle of your back. Hold for __________ seconds. Slowly lower your arm, and return to the starting position. Repeat __________ times. Complete this exercise __________ times a day. Shoulder extension Sit in a stable chair without armrests, or stand up. Secure an exercise band to a stable object in front of you so it is at shoulder height. Hold one end of the exercise band in each hand. Your palms should face each other. Straighten your elbows and lift your hands up to shoulder height. Step back, away from the secured end of the exercise band, until the band is tight and there is no slack. Squeeze your shoulder blades together as you pull your hands down to the sides of your thighs (extension). Stop when your hands are straight down by your sides. Do not let your hands go behind your body. Hold for __________ seconds. Slowly return to the starting position. Repeat __________ times. Complete this exercise __________ times a day. Shoulder row Sit in a stable chair without armrests, or stand up. Secure an exercise band to a stable object in front of you so it is at waist height. Hold one end of the exercise band in each hand. Position your palms so that your thumbs are facing the ceiling (neutral position). Bend each of your elbows to a 90-degree angle (right angle) and keep your  upper arms at your sides. Step back until the band is tight and there is no slack. Slowly pull your elbows back behind you. Hold for __________ seconds. Slowly return to the starting position. Repeat __________ times. Complete this exercise __________ times a day. Shoulder press-ups  Sit in a stable chair that has armrests.  Sit upright, with your feet flat on the floor. Put your hands on the armrests so your elbows are bent and your fingers are pointing forward. Your hands should be about even with the sides of your body. Push down on the armrests and use your arms to lift yourself off the chair. Straighten your elbows and lift yourself up as much as you comfortably can. Move your shoulder blades down, and avoid letting your shoulders move up toward your ears. Keep your feet on the ground. As you get stronger, your feet should support less of your body weight as you lift yourself up. Hold for __________ seconds. Slowly lower yourself back into the chair. Repeat __________ times. Complete this exercise __________ times a day. Wall push-ups  Stand so you are facing a stable wall. Your feet should be about one arm-length away from the wall. Lean forward and place your palms on the wall at shoulder height. Keep your feet flat on the floor as you bend your elbows and lean forward toward the wall. Hold for __________ seconds. Straighten your elbows to push yourself back to the starting position. Repeat __________ times. Complete this exercise __________ times a day. This information is not intended to replace advice given to you by your health care provider. Make sure you discuss any questions you have with your health care provider. Document Revised: 01/28/2019 Document Reviewed: 11/05/2018 Else[employer] Patient Education  [place]. Knee Exercises Ask your health care provider which exercises are safe for you. Do exercises exactly as told by your health care provider and adjust them as  directed. It is normal to feel mild stretching, pulling, tightness, or discomfort as you do these exercises. Stop right away if you feel sudden pain or your pain gets worse. Do not begin these exercises until told by your health care provider. Stretching and range-of-motion exercises These exercises warm up your muscles and joints and improve the movement and flexibility of your knee. These exercises also help to relieve pain and swelling. Knee extension, prone Lie on your abdomen (prone position) on a bed. Place your left / right knee just beyond the edge of the surface so your knee is not on the bed. You can put a towel under your left / right thigh just above your kneecap for comfort. Relax your leg muscles and allow gravity to straighten your knee (extension). You should feel a stretch behind your left / right knee. Hold this position for __________ seconds. Scoot up so your knee is supported between repetitions. Repeat __________ times. Complete this exercise __________ times a day. Knee flexion, active  Lie on your back with both legs straight. If this causes back discomfort, bend your left / right knee so your foot is flat on the floor. Slowly slide your left / right heel back toward your buttocks. Stop when you feel a gentle stretch in the front of your knee or thigh (flexion). Hold this position for __________ seconds. Slowly slide your left / right heel back to the starting position. Repeat __________ times. Complete this exercise __________ times a day. Quadriceps stretch, prone  Lie on your abdomen on a firm surface, such as a bed or padded floor. Bend your left / right knee and hold your ankle. If you cannot reach your ankle or pant leg, loop a belt around your foot and grab the belt instead. Gently pull your heel toward your buttocks. Your knee should not slide out to the side. You should feel a stretch in  the front of your thigh and knee (quadriceps). Hold this position for  __________ seconds. Repeat __________ times. Complete this exercise __________ times a day. Hamstring, supine Lie on your back (supine position). Loop a belt or towel over the ball of your left / right foot. The ball of your foot is on the walking surface, right under your toes. Straighten your left / right knee and slowly pull on the belt to raise your leg until you feel a gentle stretch behind your knee (hamstring). Do not let your knee bend while you do this. Keep your other leg flat on the floor. Hold this position for __________ seconds. Repeat __________ times. Complete this exercise __________ times a day. Strengthening exercises These exercises build strength and endurance in your knee. Endurance is the ability to use your muscles for a long time, even after they get tired. Quadriceps, isometric This exercise stretches the muscles in front of your thigh (quadriceps) without moving your knee joint (isometric). Lie on your back with your left / right leg extended and your other knee bent. Put a rolled towel or small pillow under your knee if told by your health care provider. Slowly tense the muscles in the front of your left / right thigh. You should see your kneecap slide up toward your hip or see increased dimpling just above the knee. This motion will push the back of the knee toward the floor. For __________ seconds, hold the muscle as tight as you can without increasing your pain. Relax the muscles slowly and completely. Repeat __________ times. Complete this exercise __________ times a day. Straight leg raises This exercise stretches the muscles in front of your thigh (quadriceps) and the muscles that move your hips (hip flexors). Lie on your back with your left / right leg extended and your other knee bent. Tense the muscles in the front of your left / right thigh. You should see your kneecap slide up or see increased dimpling just above the knee. Your thigh may even shake a  bit. Keep these muscles tight as you raise your leg 4-6 inches (10-15 cm) off the floor. Do not let your knee bend. Hold this position for __________ seconds. Keep these muscles tense as you lower your leg. Relax your muscles slowly and completely after each repetition. Repeat __________ times. Complete this exercise __________ times a day. Hamstring, isometric Lie on your back on a firm surface. Bend your left / right knee about __________ degrees. Dig your left / right heel into the surface as if you are trying to pull it toward your buttocks. Tighten the muscles in the back of your thighs (hamstring) to "dig" as hard as you can without increasing any pain. Hold this position for __________ seconds. Release the tension gradually and allow your muscles to relax completely for __________ seconds after each repetition. Repeat __________ times. Complete this exercise __________ times a day. Hamstring curls If told by your health care provider, do this exercise while wearing ankle weights. Begin with __________ lb weights. Then increase the weight by 1 lb (0.5 kg) increments. Do not wear ankle weights that are more than __________ lb. Lie on your abdomen with your legs straight. Bend your left / right knee as far as you can without feeling pain. Keep your hips flat against the floor. Hold this position for __________ seconds. Slowly lower your leg to the starting position. Repeat __________ times. Complete this exercise __________ times a day. Squats This exercise strengthens the muscles in  front of your thigh and knee (quadriceps). Stand in front of a table, with your feet and knees pointing straight ahead. You may rest your hands on the table for balance but not for support. Slowly bend your knees and lower your hips like you are going to sit in a chair. Keep your weight over your heels, not over your toes. Keep your lower legs upright so they are parallel with the table legs. Do not let  your hips go lower than your knees. Do not bend lower than told by your health care provider. If your knee pain increases, do not bend as low. Hold the squat position for __________ seconds. Slowly push with your legs to return to standing. Do not use your hands to pull yourself to standing. Repeat __________ times. Complete this exercise __________ times a day. Wall slides This exercise strengthens the muscles in front of your thigh and knee (quadriceps). Lean your back against a smooth wall or door, and walk your feet out 18-24 inches (46-61 cm) from it. Place your feet hip-width apart. Slowly slide down the wall or door until your knees bend __________ degrees. Keep your knees over your heels, not over your toes. Keep your knees in line with your hips. Hold this position for __________ seconds. Repeat __________ times. Complete this exercise __________ times a day. Straight leg raises This exercise strengthens the muscles that rotate the leg at the hip and move it away from your body (hip abductors). Lie on your side with your left / right leg in the top position. Lie so your head, shoulder, knee, and hip line up. You may bend your bottom knee to help you keep your balance. Roll your hips slightly forward so your hips are stacked directly over each other and your left / right knee is facing forward. Leading with your heel, lift your top leg 4-6 inches (10-15 cm). You should feel the muscles in your outer hip lifting. Do not let your foot drift forward. Do not let your knee roll toward the ceiling. Hold this position for __________ seconds. Slowly return your leg to the starting position. Let your muscles relax completely after each repetition. Repeat __________ times. Complete this exercise __________ times a day. Straight leg raises This exercise stretches the muscles that move your hips away from the front of the pelvis (hip extensors). Lie on your abdomen on a firm surface. You can  put a pillow under your hips if that is more comfortable. Tense the muscles in your buttocks and lift your left / right leg about 4-6 inches (10-15 cm). Keep your knee straight as you lift your leg. Hold this position for __________ seconds. Slowly lower your leg to the starting position. Let your leg relax completely after each repetition. Repeat __________ times. Complete this exercise __________ times a day. This information is not intended to replace advice given to you by your health care provider. Make sure you discuss any questions you have with your health care provider. Document Revised: 07/27/2018 Document Reviewed: 07/27/2018 Elsevier Patient Education  2022 [place] are taking a medication(s) that can suppress your immune system.  The following immunizations are recommended: Flu annually Covid-19  Td/Tdap (tetanus, diphtheria, pertussis) every 10 years Pneumonia (Prevnar 15 then Pneumovax 23 at least 1 year apart.  Alternatively, can take Prevnar 20 without needing additional dose) Shingrix: 2 doses from 4 weeks to 6 months apart  Please check with your PCP to make sure you are up  to date.   If you have signs or symptoms of an infection or start antibiotics: First, call your PCP for workup of your infection. Hold your medication through the infection, until you complete your antibiotics, and until symptoms resolve if you take the following: Injectable medication (Actemra, Benlysta, Cimzia, Cosentyx, Enbrel, Humira, Kevzara, Orencia, Remicade, Simponi, Stelara, Taltz, Tremfya) Methotrexate Leflunomide (Arava) Mycophenolate (Cellcept) Roma Kayser, or Rinvoq  These get annual skin examination by dermatologist to screen for skin cancer while you are on Humira.

## 2021-08-21 LAB — ANTI-NUCLEAR AB-TITER (ANA TITER)
ANA TITER: 1:40 {titer} — ABNORMAL HIGH
ANA Titer 1: 1:40 {titer} — ABNORMAL HIGH

## 2021-08-21 LAB — ANA: Anti Nuclear Antibody (ANA): POSITIVE — AB

## 2021-08-22 ENCOUNTER — Telehealth: Payer: Self-pay | Admitting: General Surgery

## 2021-08-22 NOTE — Telephone Encounter (Signed)
Returned the patients call after she had spoke with Annie Main, Therapist, sports. Patient wanted to know if we have received any paperwork from [facility] for additional FMLA. Notified the patient that we have not and she will have them send it to [place].

## 2021-08-22 NOTE — Telephone Encounter (Signed)
Pt notified that the prescription was sent to the correct pharmacy. Spoke with Olivia Mackie in [place] about pt FLMA paperwork . Olivia Mackie states that she will contact pt. Pt notified.  Pt verbalized understanding with all questions answered.

## 2021-08-26 ENCOUNTER — Telehealth: Payer: Self-pay | Admitting: Gastroenterology

## 2021-08-26 NOTE — Telephone Encounter (Signed)
Patient called today regarding her short-term disability paperwork.  It is being re-faxed as it was originally sent to the wrong fax number.  Also, she wanted to let you know that the deadline for submitting this paperwork is today.  Thank you.

## 2021-08-27 ENCOUNTER — Ambulatory Visit (HOSPITAL_COMMUNITY): Payer: BC Managed Care – PPO

## 2021-09-04 ENCOUNTER — Encounter (HOSPITAL_COMMUNITY): Payer: Self-pay

## 2021-09-04 ENCOUNTER — Ambulatory Visit (HOSPITAL_COMMUNITY)
Admission: RE | Admit: 2021-09-04 | Discharge: 2021-09-04 | Disposition: A | Discharge status: Home / Self Care | Payer: BC Managed Care – PPO | Source: Ambulatory Visit | Attending: Gastroenterology | Admitting: Gastroenterology

## 2021-09-04 ENCOUNTER — Other Ambulatory Visit: Payer: Self-pay

## 2021-09-04 DIAGNOSIS — E538 Deficiency of other specified B group vitamins: Secondary | ICD-10-CM

## 2021-09-04 DIAGNOSIS — K50919 Crohn's disease, unspecified, with unspecified complications: Secondary | ICD-10-CM

## 2021-09-04 MED ORDER — BARIUM SULFATE 0.1 % PO SUSP
ORAL | Status: AC
  Filled 2021-09-04: qty 3

## 2021-09-04 MED ORDER — IOHEXOL 350 MG/ML SOLN: 80.0000 mL | Freq: Once | INTRAVENOUS | Status: DC | PRN

## 2021-09-11 ENCOUNTER — Encounter: Payer: Self-pay | Admitting: Gastroenterology

## 2021-09-11 NOTE — Telephone Encounter (Signed)
Pt states that she has started the humira but still is having some Nausea. Pt is requesting an additional FLMA. Pt was scheduled on OV to see Dr. Lyndel Safe 09/25/2021 @ 2:10. Pt made aware Pt verbalized understanding with all questions answered.

## 2021-09-17 ENCOUNTER — Encounter: Payer: Self-pay | Admitting: Gastroenterology

## 2021-09-17 NOTE — Telephone Encounter (Signed)
Pt states that about 1 hours after she took her Humira Injection last night that she started to feel  nauseas, dizzy, headache. Pt states that she still has these symptoms. Pt states that she is still having watery stools.  Please Advise

## 2021-09-18 ENCOUNTER — Encounter: Payer: Self-pay | Admitting: Gastroenterology

## 2021-09-19 NOTE — Telephone Encounter (Signed)
[facility] was contacted. Representative [place] Stated that this Medication Humira has been approved until 2023. Test Claim was ran on this and it was successful. Pt notified that this Medication Prior Authorization has been approved until 06/20/2022 Pt verbalized understanding with all questions answered.

## 2021-09-25 ENCOUNTER — Encounter: Payer: Self-pay | Admitting: Gastroenterology

## 2021-09-25 ENCOUNTER — Other Ambulatory Visit: Payer: Self-pay

## 2021-09-25 ENCOUNTER — Ambulatory Visit (INDEPENDENT_AMBULATORY_CARE_PROVIDER_SITE_OTHER): Payer: BC Managed Care – PPO | Admitting: Gastroenterology

## 2021-09-25 VITALS — BP 122/72 | HR 67 | Ht 63.0 in | Wt 165.2 lb

## 2021-09-25 DIAGNOSIS — K50919 Crohn's disease, unspecified, with unspecified complications: Secondary | ICD-10-CM | POA: Diagnosis not present

## 2021-09-25 DIAGNOSIS — R110 Nausea: Secondary | ICD-10-CM | POA: Diagnosis not present

## 2021-09-25 DIAGNOSIS — E538 Deficiency of other specified B group vitamins: Secondary | ICD-10-CM

## 2021-09-25 MED ORDER — PANTOPRAZOLE SODIUM 40 MG PO TBEC: 40.0000 mg | DELAYED_RELEASE_TABLET | Freq: Every day | ORAL | 3 refills | Status: AC

## 2021-09-25 MED ORDER — ONDANSETRON 4 MG PO TBDP: 4.0000 mg | ORAL_TABLET | Freq: Four times a day (QID) | ORAL | 0 refills | Status: DC | PRN

## 2021-09-25 NOTE — Progress Notes (Signed)
Chief Complaint: FU  Referring Provider:  Zoila Shutter, NP      ASSESSMENT AND PLAN;   #1.  Crohn's disease with anastomotic recurrence on CT 12/2019. Started Humira Sep 03, 2021.   Dx 2005 at age 40 s/p R ileocolectomy (2005), with postoperative recurrance, moderate activity on colon 07/2015. Refused any meds for Crohn's d/t S/Es.  Self controlled on CBD oil.  Has associated IBS with diarrhea and element of postcholecystectomy diarrhea. Neg stool for GI pathogen, C diff 03/2021 with elevated calprotectin. Nl CRP. Neg TMPT 08/2021. S/P hep B vaccine.  #2.  Associated B12 deficiency. Last B12 was normal.  #3. nausea  Plan: - Continue Humira 76m SQ every other week. - CBC with diff, CMP, CRP, sed rate, B12 - Stool for GI pathogen and calprotectin in Jan 2023 - Protonix 42mpo qd #30 - Zofran 4m2mDT Q6hrs prn #20 - FU in 12 weeks. - If still with problems, would reattempt CTE after premedication with Phenergan or Zofran  HPI:    Monica Barker a [age]o. female   For FU visit.   Started Humira 09/03/2021. No further abdo pain since Humira  Currently having softer 4 Bms/day.  Mostly after eating.  Urgency has reduced. No blood.  Continues to have nausea.  Better with Zofran  CTE - attempted but she didn't tolerate contrast. Hence, not done.  She is eating much better.  Would like to go back to work on 10/14/2021.  I think it will be reasonable.  Wants us [place] fill the  FMLA forms. She will send those.  Has gained wt.   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 Bx- C/W ileitis. Neg random colon biopsies - Diminutive colonic polyp s/p polypectomy. Neg Bx - No colonic or perianal involvement.  EGD 04/18/2021 - Minimal  gastritis. No evidence of upper GI Crohn's disease. Bx- neg for [place] - SB bx were negative for celiac disease.  CT AP 12/2019 Redemonstrated postoperative findings of terminal ileocolectomy and reanastomosis. There is new inflammatory bowel wall thickening of the most distal remaining 10 cm of ileum at the anastomosis. No evidence of complicating fistula, abscess, or stricture. No evident inflammatory involvement of the colon or rectum.    Past Medical History:  Diagnosis Date   ADHD (attention deficit hyperactivity disorder)    Bipolar disorder ([place]  [place] disease ([place]  Dx 2005 at age 48 105p R hemicolectomy, with postoperative recurrance, moderate activity on colonoscopy 08/08/2015   Depressive disorder    Dysphagia    Dysthymic disorder    IBS (irritable bowel syndrome)    IBS (irritable bowel syndrome)    with diarrhea   Skin cancer    TMJ (temporomandibular joint disorder)     Past Surgical History:  Procedure Laterality Date   APPENDECTOMY     CHOLECYSTECTOMY  2014   COLON SURGERY  05/2004   [place]   COLONOSCOPY  09/02/2017   Recurrance of Crohn's disease in the neo terminal ileum (biopsied)- mild. Minimal rectal erythema (? importance-biopsed). No evidence of peranal Crohn's disease. Status post ileocolectomy.    COLONOSCOPY  09/02/2017   Recurrance of Crohn's  Disease in the neo terminal ileum (biopsed)-mild. Minimal rectal erythema (? Importance-biposed) No Evidence of peranal crohn's disease. Status post ileccolectomy.   ESOPHAGOGASTRODUODENOSCOPY  10/03/2013   Mild gastritis. Normal EGD   ESOPHAGOGASTRODUODENOSCOPY  10/03/2013   Mild Gastritis. Normal EGD    Family History  Problem Relation Age of Onset   Skin cancer Father    COPD Father    Gout Father    Arthritis Father    Hypertension Father    Diabetes Brother    Drug abuse Brother    Colon cancer Paternal Aunt        Paternal mom's sister/PGM's sister   Breast  cancer Maternal Grandmother        happened twice/x2   Skin cancer Paternal Grandmother    Endometriosis Sister    ADD / ADHD Son    Healthy Son    Esophageal cancer Neg Hx     Social History   Tobacco Use   Smoking status: Former   Smokeless tobacco: Never   Tobacco comments:    09/2019  Vaping Use   Vaping Use: Former   Devices: occasionally   Substance Use Topics   Alcohol use: Not Currently   Drug use: Not Currently    Current Outpatient Medications  Medication Sig Dispense Refill   Adalimumab (HUMIRA PEN) 40 MG/0.4ML PNKT Starting day 29 inject SQ 40 mg and repeat every 14 days 2 each 6   Adalimumab (HUMIRA PEN-CD/UC/HS STARTER) 80 MG/0.8ML PNKT Inject SQ 160 mg day 1 then inject 80 mg SQ day 15 then begin maintenance dose 1 each 0   ALPRAZolam (XANAX) 0.5 MG tablet Take 0.5 mg by mouth 3 (three) times daily as needed for anxiety.     budesonide (ENTOCORT EC) 3 MG 24 hr capsule Take 3 capsules (9 mg total) by mouth daily. Please take 65m daily for 4 weeks. (Patient not taking: Reported on 04/18/2021) 90 capsule 2   cyanocobalamin (,VITAMIN B-12,) 1000 MCG/ML injection INJECT 1 ML (1,000 MCG TOTAL) INTO THE MUSCLE EVERY 30 (THIRTY) DAYS. 1 mL 12   Multiple Vitamin (MULTIVITAMIN) capsule Take 1 capsule by mouth daily.     pantoprazole (PROTONIX) 40 MG tablet Take 1 tablet (40 mg total) by mouth daily. 90 tablet 3   No current facility-administered medications for this visit.    Allergies  Allergen Reactions   Ativan [Lorazepam]     intesitided her anxiety    Latex    Latex Other (See Comments)    unknown    Review of Systems:  neg     Physical Exam:    BP 122/72 (BP Location: Right Arm, Patient Position: Sitting, Cuff Size: Normal)   Pulse 67   Ht 5' 3"  (1.6 m)   Wt 165 lb 4 oz (75 kg)   SpO2 97%   BMI 29.27 kg/m  Filed Weights   09/25/21 1403  Weight: 165 lb 4 oz (75 kg)   Gen: awake, alert, NAD HEENT: anicteric, no pallor CV: RRR, no mrg Pulm:  CTA b/l Abd: soft, NT/ND, +BS throughout Ext: no c/c/e Neuro: nonfocal   Data Reviewed: I have personally reviewed following labs and imaging studies  CBC: CBC Latest Ref Rng & Units 08/19/2021 03/01/2021 12/27/2019  WBC 4.0 - 10.5 K/uL 6.8 7.5 6.1  Hemoglobin 12.0 - 15.0 g/dL 12.3 12.1 13.4  Hematocrit 36.0 - 46.0 % 38.1 36.7 40.2  Platelets 150.0 - 400.0 K/uL 290.0 304.0 272.0    CMP: CMP Latest Ref Rng &  Units 08/19/2021 07/30/2021 03/01/2021  Glucose 70 - 99 mg/dL 80 - 89  BUN 6 - 23 mg/dL 7 - 9  Creatinine 0.40 - 1.20 mg/dL 0.72 - 0.71  Sodium 135 - 145 mEq/L 141 - 139  Potassium 3.5 - 5.1 mEq/L 4.1 - 3.9  Chloride 96 - 112 mEq/L 107 - 104  CO2 19 - 32 mEq/L 28 - 28  Calcium 8.4 - 10.5 mg/dL 8.9 - 8.7  Total Protein 6.0 - 8.3 g/dL 6.7 6.9 6.7  Total Bilirubin 0.2 - 1.2 mg/dL 0.3 - 0.2  Alkaline Phos 39 - 117 U/L 45 - 54  AST 0 - 37 U/L 12 - 12  ALT 0 - 35 U/L 10 - 12        Carmell Austria, MD 09/25/2021, 2:11 PM  Cc: Zoila Shutter, NP

## 2021-09-25 NOTE — Patient Instructions (Addendum)
If you are age 38 or older, your body mass index should be between 23-30. Your Body mass index is 29.27 kg/m. If this is out of the aforementioned range listed, please consider follow up with your Primary Care Provider.  If you are age 68 or younger, your body mass index should be between 19-25. Your Body mass index is 29.27 kg/m. If this is out of the aformentioned range listed, please consider follow up with your Primary Care Provider.   ________________________________________________________  The [facility] providers would like to encourage you to use [facility] to communicate with providers for non-urgent requests or questions.  Due to long hold times on the telephone, sending your provider a message by [facility] may be a faster and more efficient way to get a response.  Please allow 48 business hours for a response.  Please remember that this is for non-urgent requests.  _______________________________________________________  We have sent the following medications to your pharmacy for you to pick up at your convenience: Protonix Zofran  Please go to the lab on the 2nd floor [address] before you leave the office today.  You will get blood work today and you get your stool kit to take home with you. Wait until January to complete stool studies and you have to bring them back to [address]. They are open Monday through Friday 8am to 4pm and closed for lunch 12pm to 1pm  FLMA paperwork for 2 more weeks from todays date but make your insurance company send over the paperwork for [place]. Fax number [phone]  Please call in 3 months to make a follow up appointment.  Please call with any questions or concerns.  Thank you,  Dr. Jackquline Denmark

## 2021-09-26 ENCOUNTER — Telehealth: Payer: Self-pay | Admitting: Gastroenterology

## 2021-09-26 NOTE — Telephone Encounter (Signed)
Inbound call from patient. States her insurance company should be sending over [place] papers and if not received by next week to give her a call.

## 2021-09-26 NOTE — Telephone Encounter (Addendum)
3 pages we have received and we will doctor to sign as soon as he can and fax ov notes when done  LVM as well for patient We need to speak with patient

## 2021-09-27 ENCOUNTER — Encounter: Payer: Self-pay | Admitting: Gastroenterology

## 2021-09-27 NOTE — Telephone Encounter (Signed)
Pt stated that she has received these phone call before in regard to her Humira although she states that they still sent the Humira. Pt was notified that she HAS been approved for the Humira.  Pt was given the number to [place] to reach out to them and see exactly what they state and the reason why.  Pt verbalized understanding with all questions answered.

## 2021-10-10 ENCOUNTER — Encounter: Payer: Self-pay | Admitting: Gastroenterology

## 2021-10-15 ENCOUNTER — Encounter: Payer: Self-pay | Admitting: Gastroenterology

## 2021-10-29 NOTE — Telephone Encounter (Unsigned)
Left message for pt to call back  °

## 2021-11-04 ENCOUNTER — Encounter: Payer: Self-pay | Admitting: Gastroenterology

## 2021-11-05 ENCOUNTER — Telehealth: Payer: Self-pay

## 2021-11-05 NOTE — Telephone Encounter (Signed)
Pt states that she is [redacted] weeks pregnant:  Pt currently is on Humira Injections: Pt states her NEXT Humira injection is due tomorrow.  Pt is questioning should she continue the Humira Injections during pregnancy and is there are any other recommendations that Dr. Lyndel Safe has for her.  Please Advise

## 2021-11-05 NOTE — Telephone Encounter (Signed)
Left message for pt to call back  °

## 2021-11-07 NOTE — Telephone Encounter (Signed)
Must continue Humira throughout pregnancy RG

## 2021-11-07 NOTE — Telephone Encounter (Signed)
Pt made aware of Dr. Lyndel Safe recommendations: Pt verbalized understanding with all questions answered.

## 2021-11-08 NOTE — Progress Notes (Deleted)
Office Visit Note  Patient: Monica Barker             Date of Birth: 07-Jun-1981           MRN: [id number]             PCP: Zoila Shutter, NP Referring: Zoila Shutter, NP Visit Date: 11/20/2021 Occupation: @GUAROCC @  Subjective:  No chief complaint on file.   History of Present Illness: Monica Barker is a 41 y.o. female ***   Activities of Daily Living:  Patient reports morning stiffness for *** {minute/hour:19697}.   Patient {ACTIONS;DENIES/REPORTS:21021675::"Denies"} nocturnal pain.  Difficulty dressing/grooming: {ACTIONS;DENIES/REPORTS:21021675::"Denies"} Difficulty climbing stairs: {ACTIONS;DENIES/REPORTS:21021675::"Denies"} Difficulty getting out of chair: {ACTIONS;DENIES/REPORTS:21021675::"Denies"} Difficulty using hands for taps, buttons, cutlery, and/or writing: {ACTIONS;DENIES/REPORTS:21021675::"Denies"}  No Rheumatology ROS completed.   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/2021   Essential hypertension 08/20/2021   History of hyperlipidemia 08/20/2021   History of type 2 diabetes mellitus 08/20/2021   History of hypothyroidism 08/20/2021   Chronic fatigue syndrome 08/20/2021   Vitamin D deficiency 08/20/2021   Vitamin B12 deficiency 08/20/2021   GAD (generalized anxiety disorder) 08/20/2021   Other insomnia 08/20/2021    Past Medical History:  Diagnosis Date   ADHD (attention deficit hyperactivity disorder)    Bipolar disorder ([place])    COVID-19 virus infection    11/22/2019 tested positive   Crohn disease ([place])    Dx 2005 at age 53 s/p R hemicolectomy, with postoperative recurrance, moderate activity on colonoscopy 08/08/2015   Depressive disorder    Dysphagia    Dysthymic disorder    IBS (irritable bowel  syndrome)    IBS (irritable bowel syndrome)    with diarrhea   Skin cancer    TMJ (temporomandibular joint disorder)     Family History  Problem Relation Age of Onset   Skin cancer Father    COPD Father    Gout Father    Arthritis Father    Hypertension Father    Diabetes Brother    Drug abuse Brother    Colon cancer Paternal Aunt        Paternal mom's sister/PGM's sister   Breast cancer Maternal Grandmother        happened twice/x2   Skin cancer Paternal Grandmother    Endometriosis Sister    ADD / ADHD Son    Healthy Son    Esophageal cancer Neg Hx    Past Surgical History:  Procedure Laterality Date   APPENDECTOMY     CHOLECYSTECTOMY  2014   COLON SURGERY  05/2004   due to [place]  05/2004   COLONOSCOPY  09/02/2017   Recurrance of Crohn's disease in the neo terminal ileum (biopsied)- mild. Minimal rectal erythema (? importance-biopsed). No evidence of peranal Crohn's disease. Status post ileocolectomy.    COLONOSCOPY  09/02/2017   Recurrance of Crohn's Disease in the neo terminal ileum (biopsed)-mild. Minimal rectal erythema (? Importance-biposed) No Evidence of peranal crohn's disease. Status post ileccolectomy.   ESOPHAGOGASTRODUODENOSCOPY  10/03/2013   Mild gastritis. Normal EGD   ESOPHAGOGASTRODUODENOSCOPY  10/03/2013   Mild Gastritis. Normal EGD   Social History   Social History Narrative   ** Merged History Encounter **       Immunization History  Administered Date(s) Administered   Hep A / Hep B 03/13/2021, 04/16/2021     Objective: Vital Signs: There were no vitals taken for this visit.   Physical Exam   Musculoskeletal Exam: ***  CDAI Exam: CDAI Score: -- Patient Global: --; Provider Global: -- Swollen: --; Tender: -- Joint Exam 11/20/2021   No joint exam has been documented for this visit   There is currently no information documented on the homunculus. Go to the Rheumatology activity and complete the homunculus joint  exam.  Investigation: No additional findings.  Imaging: No results found.  Recent Labs: Lab Results  Component Value Date   WBC 6.8 08/19/2021   HGB 12.3 08/19/2021   PLT 290.0 08/19/2021   NA 141 08/19/2021   K 4.1 08/19/2021   CL 107 08/19/2021   CO2 28 08/19/2021   GLUCOSE 80 08/19/2021   BUN 7 08/19/2021   CREATININE 0.72 08/19/2021   BILITOT 0.3 08/19/2021   ALKPHOS 45 08/19/2021   AST 12 08/19/2021   ALT 10 08/19/2021   PROT 6.7 08/19/2021   ALBUMIN 4.2 08/19/2021   CALCIUM 8.9 08/19/2021   GFRAA  05/14/2008    >60        The eGFR has been calculated using the MDRD equation. This calculation has not been validated in all clinical   QFTBGOLDPLUS NEGATIVE 03/01/2021    Speciality Comments: No specialty comments available.  Procedures:  No procedures performed Allergies: Ativan [lorazepam], Latex, and Latex   Assessment / Plan:     Visit Diagnoses: No diagnosis found.  Orders: No orders of the defined types were placed in this encounter.  No orders of the defined types were placed in this encounter.   Face-to-face time spent with patient was *** minutes. Greater than 50% of time was spent in counseling and coordination of care.  Follow-Up Instructions: No follow-ups on file.   Earnestine Mealing, CMA  Note - This record has been created using Editor, commissioning.  Chart creation errors have been sought, but may not always  have been located. Such creation errors do not reflect on  the standard of medical care.

## 2021-11-20 ENCOUNTER — Ambulatory Visit: Payer: BC Managed Care – PPO | Admitting: Rheumatology

## 2021-11-20 DIAGNOSIS — I10 Essential (primary) hypertension: Secondary | ICD-10-CM

## 2021-11-20 DIAGNOSIS — G9332 Myalgic encephalomyelitis/chronic fatigue syndrome: Secondary | ICD-10-CM

## 2021-11-20 DIAGNOSIS — Z8639 Personal history of other endocrine, nutritional and metabolic disease: Secondary | ICD-10-CM

## 2021-11-20 DIAGNOSIS — M19041 Primary osteoarthritis, right hand: Secondary | ICD-10-CM

## 2021-11-20 DIAGNOSIS — E538 Deficiency of other specified B group vitamins: Secondary | ICD-10-CM

## 2021-11-20 DIAGNOSIS — E559 Vitamin D deficiency, unspecified: Secondary | ICD-10-CM

## 2021-11-20 DIAGNOSIS — R002 Palpitations: Secondary | ICD-10-CM

## 2021-11-20 DIAGNOSIS — G8929 Other chronic pain: Secondary | ICD-10-CM

## 2021-11-20 DIAGNOSIS — G4709 Other insomnia: Secondary | ICD-10-CM

## 2021-11-20 DIAGNOSIS — M19071 Primary osteoarthritis, right ankle and foot: Secondary | ICD-10-CM

## 2021-11-20 DIAGNOSIS — A421 Abdominal actinomycosis: Secondary | ICD-10-CM

## 2021-11-20 DIAGNOSIS — M2242 Chondromalacia patellae, left knee: Secondary | ICD-10-CM

## 2021-11-20 DIAGNOSIS — Z8719 Personal history of other diseases of the digestive system: Secondary | ICD-10-CM

## 2021-11-20 DIAGNOSIS — F411 Generalized anxiety disorder: Secondary | ICD-10-CM

## 2021-11-20 HISTORY — PX: DILATION AND CURETTAGE OF UTERUS: SHX78

## 2021-11-27 ENCOUNTER — Encounter: Payer: Self-pay | Admitting: Gastroenterology

## 2021-12-06 NOTE — Telephone Encounter (Signed)
Very sorry to hear that Please let me know if I can be of any help RG

## 2021-12-10 ENCOUNTER — Telehealth: Payer: Self-pay

## 2021-12-10 NOTE — Telephone Encounter (Signed)
Received PA report from [facility]: Left message for pt to call back

## 2021-12-11 ENCOUNTER — Encounter: Payer: Self-pay | Admitting: Gastroenterology

## 2021-12-11 NOTE — Telephone Encounter (Signed)
Left message for pt to call back  °

## 2021-12-11 NOTE — Telephone Encounter (Signed)
Insurance information updated.. thank you

## 2021-12-11 NOTE — Telephone Encounter (Signed)
Pt stated that she received a new job with a new insurance: Pt sent insurance card pictures through [facility] CHART: PA for Humira along with pt records sent to [facility]. Awaiting PA approval:

## 2021-12-16 NOTE — Telephone Encounter (Signed)
Request for PA was sent to [facility]: Humira has been approved through 12/12/2022: Pt made aware Pt verbalized understanding with all questions answered.

## 2021-12-27 NOTE — Progress Notes (Deleted)
? ?Office Visit Note ? ?Patient: Salene Mohamud             ?Date of Birth: 16-Apr-1981           ?MRN: [id number]             ?PCP: Zoila Shutter, NP ?Referring: Zoila Shutter, NP ?Visit Date: 01/10/2022 ?Occupation: @GUAROCC @ ? ?Subjective:  ?No chief complaint on file. ? ? ?History of Present Illness: Inika Bellanger is a 41 y.o. female ***  ? ?Activities of Daily Living:  ?Patient reports morning stiffness for *** {minute/hour:19697}.   ?Patient {ACTIONS;DENIES/REPORTS:21021675::"Denies"} nocturnal pain.  ?Difficulty dressing/grooming: {ACTIONS;DENIES/REPORTS:21021675::"Denies"} ?Difficulty climbing stairs: {ACTIONS;DENIES/REPORTS:21021675::"Denies"} ?Difficulty getting out of chair: {ACTIONS;DENIES/REPORTS:21021675::"Denies"} ?Difficulty using hands for taps, buttons, cutlery, and/or writing: {ACTIONS;DENIES/REPORTS:21021675::"Denies"} ? ?No Rheumatology ROS completed.  ? ?PMFS History:  ?Patient Active Problem List  ? Diagnosis Date Noted  ? Primary osteoarthritis of both hands 08/20/2021  ? Chondromalacia patellae, left knee 08/20/2021  ? Primary osteoarthritis of both feet 08/20/2021  ? History of Crohn's disease 08/20/2021  ? Abdominal actinomycosis 08/20/2021  ? Essential hypertension 08/20/2021  ? History of hyperlipidemia 08/20/2021  ? History of type 2 diabetes mellitus 08/20/2021  ? History of hypothyroidism 08/20/2021  ? Chronic fatigue syndrome 08/20/2021  ? Vitamin D deficiency 08/20/2021  ? Vitamin B12 deficiency 08/20/2021  ? GAD (generalized anxiety disorder) 08/20/2021  ? Other insomnia 08/20/2021  ?  ?Past Medical History:  ?Diagnosis Date  ? ADHD (attention deficit hyperactivity disorder)   ? Bipolar disorder ([place])   ? COVID-19 virus infection   ? 11/22/2019 tested positive  ? Crohn disease ([place])   ? Dx 2005 at age 26 s/p R hemicolectomy, with postoperative recurrance, moderate activity on colonoscopy 08/08/2015  ? Depressive disorder   ? Dysphagia   ? Dysthymic disorder   ? IBS (irritable bowel  syndrome)   ? IBS (irritable bowel syndrome)   ? with diarrhea  ? Skin cancer   ? TMJ (temporomandibular joint disorder)   ?  ?Family History  ?Problem Relation Age of Onset  ? Skin cancer Father   ? COPD Father   ? Gout Father   ? Arthritis Father   ? Hypertension Father   ? Diabetes Brother   ? Drug abuse Brother   ? Colon cancer Paternal Aunt   ?     Paternal mom's sister/PGM's sister  ? Breast cancer Maternal Grandmother   ?     happened twice/x2  ? Skin cancer Paternal Grandmother   ? Endometriosis Sister   ? ADD / ADHD Son   ? Healthy Son   ? Esophageal cancer Neg Hx   ? ?Past Surgical History:  ?Procedure Laterality Date  ? APPENDECTOMY    ? CHOLECYSTECTOMY  2014  ? COLON SURGERY  05/2004  ? due to Crohns  ? COLON SURGERY  05/2004  ? COLONOSCOPY  09/02/2017  ? Recurrance of Crohn's disease in the neo terminal ileum (biopsied)- mild. Minimal rectal erythema (? importance-biopsed). No evidence of peranal Crohn's disease. Status post ileocolectomy.   ? COLONOSCOPY  09/02/2017  ? Recurrance of Crohn's Disease in the neo terminal ileum (biopsed)-mild. Minimal rectal erythema (? Importance-biposed) No Evidence of peranal crohn's disease. Status post ileccolectomy.  ? ESOPHAGOGASTRODUODENOSCOPY  10/03/2013  ? Mild gastritis. Normal EGD  ? ESOPHAGOGASTRODUODENOSCOPY  10/03/2013  ? Mild Gastritis. Normal EGD  ? ?Social History  ? ?Social History Narrative  ? ** Merged History Encounter **  ?    ? ?Immunization History  ?  Administered Date(s) Administered  ? Hep A / Hep B 03/13/2021, 04/16/2021  ?  ? ?Objective: ?Vital Signs: There were no vitals taken for this visit.  ? ?Physical Exam  ? ?Musculoskeletal Exam: *** ? ?CDAI Exam: ?CDAI Score: -- ?Patient Global: --; Provider Global: -- ?Swollen: --; Tender: -- ?Joint Exam 01/10/2022  ? ?No joint exam has been documented for this visit  ? ?There is currently no information documented on the homunculus. Go to the Rheumatology activity and complete the homunculus joint  exam. ? ?Investigation: ?No additional findings. ? ?Imaging: ?No results found. ? ?Recent Labs: ?Lab Results  ?Component Value Date  ? WBC 6.8 08/19/2021  ? HGB 12.3 08/19/2021  ? PLT 290.0 08/19/2021  ? NA 141 08/19/2021  ? K 4.1 08/19/2021  ? CL 107 08/19/2021  ? CO2 28 08/19/2021  ? GLUCOSE 80 08/19/2021  ? BUN 7 08/19/2021  ? CREATININE 0.72 08/19/2021  ? BILITOT 0.3 08/19/2021  ? ALKPHOS 45 08/19/2021  ? AST 12 08/19/2021  ? ALT 10 08/19/2021  ? PROT 6.7 08/19/2021  ? ALBUMIN 4.2 08/19/2021  ? CALCIUM 8.9 08/19/2021  ? GFRAA  05/14/2008  ?  >60        ?The eGFR has been calculated ?using the MDRD equation. ?This calculation has not been ?validated in all clinical  ? QFTBGOLDPLUS NEGATIVE 03/01/2021  ? ? ?Speciality Comments: No specialty comments available. ? ?Procedures:  ?No procedures performed ?Allergies: Ativan [lorazepam], Latex, and Latex  ? ?Assessment / Plan:     ?Visit Diagnoses: Chronic pain of both shoulders ? ?Primary osteoarthritis of both hands ? ?Chondromalacia patellae, left knee ? ?Primary osteoarthritis of both feet ? ?History of Crohn's disease ? ?Abdominal actinomycosis ? ?Essential hypertension ? ?History of hyperlipidemia ? ?Palpitations ? ?History of type 2 diabetes mellitus ? ?History of hypothyroidism ? ?Chronic fatigue syndrome ? ?Other insomnia ? ?GAD (generalized anxiety disorder) ? ?Vitamin D deficiency ? ?Vitamin B12 deficiency ? ?Orders: ?No orders of the defined types were placed in this encounter. ? ?No orders of the defined types were placed in this encounter. ? ? ?Face-to-face time spent with patient was *** minutes. Greater than 50% of time was spent in counseling and coordination of care. ? ?Follow-Up Instructions: No follow-ups on file. ? ? ?Ofilia Neas, PA-C ? ?Note - This record has been created using [employer].  ?Chart creation errors have been sought, but may not always  ?have been located. Such creation errors do not reflect on  ?the standard of medical  care. ? ?

## 2022-01-07 NOTE — Telephone Encounter (Signed)
Humira does not cause weight gain by itself ?PCP may have to look for other causes-like check thyroid etc ?Also start walking or exercising ? ?RG ?

## 2022-01-10 ENCOUNTER — Ambulatory Visit: Payer: Commercial Managed Care - PPO | Admitting: Rheumatology

## 2022-01-10 DIAGNOSIS — I10 Essential (primary) hypertension: Secondary | ICD-10-CM

## 2022-01-10 DIAGNOSIS — G4709 Other insomnia: Secondary | ICD-10-CM

## 2022-01-10 DIAGNOSIS — Z8639 Personal history of other endocrine, nutritional and metabolic disease: Secondary | ICD-10-CM

## 2022-01-10 DIAGNOSIS — M19072 Primary osteoarthritis, left ankle and foot: Secondary | ICD-10-CM

## 2022-01-10 DIAGNOSIS — E559 Vitamin D deficiency, unspecified: Secondary | ICD-10-CM

## 2022-01-10 DIAGNOSIS — G9332 Myalgic encephalomyelitis/chronic fatigue syndrome: Secondary | ICD-10-CM

## 2022-01-10 DIAGNOSIS — G8929 Other chronic pain: Secondary | ICD-10-CM

## 2022-01-10 DIAGNOSIS — E538 Deficiency of other specified B group vitamins: Secondary | ICD-10-CM

## 2022-01-10 DIAGNOSIS — A421 Abdominal actinomycosis: Secondary | ICD-10-CM

## 2022-01-10 DIAGNOSIS — F411 Generalized anxiety disorder: Secondary | ICD-10-CM

## 2022-01-10 DIAGNOSIS — M2242 Chondromalacia patellae, left knee: Secondary | ICD-10-CM

## 2022-01-10 DIAGNOSIS — M19041 Primary osteoarthritis, right hand: Secondary | ICD-10-CM

## 2022-01-10 DIAGNOSIS — M19071 Primary osteoarthritis, right ankle and foot: Secondary | ICD-10-CM

## 2022-01-10 DIAGNOSIS — Z8719 Personal history of other diseases of the digestive system: Secondary | ICD-10-CM

## 2022-01-10 DIAGNOSIS — M25512 Pain in left shoulder: Secondary | ICD-10-CM

## 2022-01-10 DIAGNOSIS — R002 Palpitations: Secondary | ICD-10-CM

## 2022-02-11 ENCOUNTER — Telehealth: Payer: Self-pay | Admitting: Gastroenterology

## 2022-02-11 NOTE — Telephone Encounter (Signed)
Patient called, has scheduled OV 03/06/22. Per patient, takes humira and has gained weight since being on it. PCP states that it may be related to heart failure. Patient wants to know if she should quit taking medications now since she will discuss that medication during her OV. Please advise. ?

## 2022-02-11 NOTE — Telephone Encounter (Signed)
Pt stated that she has been gaining weight and is questioning can it be the Humira: Pt states that she read on Google that the medication can be related to heart failure:  ?Pt was reminded of My Chart Message that was sent in March ?Humira does not cause weight gain by itself ?PCP may have to look for other causes-like check thyroid etc ?Also start walking or exercising ?RG ? ?Pt stated that she does have heart palpitations and it seems that these have been more frequently recently  although pt stated that she has had heart palpations for 20 years and also wonders if it could be from her anxiety: ?Pt was encouraged to reach out to her PCP to get a referral for a Cardiologist: Pt has been scheduled for an appointment to see Dr. Lyndel Safe on 03/06/2022 at 3:40: Pt aware ?Pt verbalized understanding with all questions answered.  ? ?

## 2022-02-27 ENCOUNTER — Other Ambulatory Visit: Payer: Self-pay

## 2022-02-27 DIAGNOSIS — M26609 Unspecified temporomandibular joint disorder, unspecified side: Secondary | ICD-10-CM | POA: Insufficient documentation

## 2022-02-27 DIAGNOSIS — C4490 Unspecified malignant neoplasm of skin, unspecified: Secondary | ICD-10-CM | POA: Insufficient documentation

## 2022-02-27 DIAGNOSIS — R45850 Homicidal ideations: Secondary | ICD-10-CM | POA: Insufficient documentation

## 2022-02-27 DIAGNOSIS — R509 Fever, unspecified: Secondary | ICD-10-CM

## 2022-02-27 DIAGNOSIS — U071 COVID-19: Secondary | ICD-10-CM | POA: Insufficient documentation

## 2022-02-27 DIAGNOSIS — Z111 Encounter for screening for respiratory tuberculosis: Secondary | ICD-10-CM

## 2022-02-27 DIAGNOSIS — K589 Irritable bowel syndrome without diarrhea: Secondary | ICD-10-CM | POA: Insufficient documentation

## 2022-02-27 DIAGNOSIS — J301 Allergic rhinitis due to pollen: Secondary | ICD-10-CM

## 2022-02-27 DIAGNOSIS — F909 Attention-deficit hyperactivity disorder, unspecified type: Secondary | ICD-10-CM | POA: Insufficient documentation

## 2022-02-27 DIAGNOSIS — F3131 Bipolar disorder, current episode depressed, mild: Secondary | ICD-10-CM

## 2022-02-27 DIAGNOSIS — F410 Panic disorder [episodic paroxysmal anxiety] without agoraphobia: Secondary | ICD-10-CM

## 2022-02-27 DIAGNOSIS — R41840 Attention and concentration deficit: Secondary | ICD-10-CM

## 2022-02-27 DIAGNOSIS — K5090 Crohn's disease, unspecified, without complications: Secondary | ICD-10-CM | POA: Insufficient documentation

## 2022-02-27 DIAGNOSIS — F341 Dysthymic disorder: Secondary | ICD-10-CM | POA: Insufficient documentation

## 2022-02-27 DIAGNOSIS — D518 Other vitamin B12 deficiency anemias: Secondary | ICD-10-CM | POA: Insufficient documentation

## 2022-02-27 DIAGNOSIS — F4310 Post-traumatic stress disorder, unspecified: Secondary | ICD-10-CM | POA: Insufficient documentation

## 2022-02-27 DIAGNOSIS — N926 Irregular menstruation, unspecified: Secondary | ICD-10-CM

## 2022-02-27 DIAGNOSIS — Z720 Tobacco use: Secondary | ICD-10-CM | POA: Insufficient documentation

## 2022-02-27 DIAGNOSIS — R1310 Dysphagia, unspecified: Secondary | ICD-10-CM | POA: Insufficient documentation

## 2022-02-27 DIAGNOSIS — Z716 Tobacco abuse counseling: Secondary | ICD-10-CM | POA: Insufficient documentation

## 2022-02-27 DIAGNOSIS — E119 Type 2 diabetes mellitus without complications: Secondary | ICD-10-CM | POA: Insufficient documentation

## 2022-02-27 DIAGNOSIS — A6000 Herpesviral infection of urogenital system, unspecified: Secondary | ICD-10-CM | POA: Insufficient documentation

## 2022-02-27 DIAGNOSIS — K2990 Gastroduodenitis, unspecified, without bleeding: Secondary | ICD-10-CM

## 2022-02-27 DIAGNOSIS — J069 Acute upper respiratory infection, unspecified: Secondary | ICD-10-CM

## 2022-02-27 DIAGNOSIS — E039 Hypothyroidism, unspecified: Secondary | ICD-10-CM | POA: Insufficient documentation

## 2022-02-27 DIAGNOSIS — W57XXXA Bitten or stung by nonvenomous insect and other nonvenomous arthropods, initial encounter: Secondary | ICD-10-CM

## 2022-02-27 DIAGNOSIS — F319 Bipolar disorder, unspecified: Secondary | ICD-10-CM | POA: Insufficient documentation

## 2022-02-27 DIAGNOSIS — E782 Mixed hyperlipidemia: Secondary | ICD-10-CM | POA: Insufficient documentation

## 2022-02-27 DIAGNOSIS — Z0000 Encounter for general adult medical examination without abnormal findings: Secondary | ICD-10-CM

## 2022-02-27 DIAGNOSIS — I479 Paroxysmal tachycardia, unspecified: Secondary | ICD-10-CM | POA: Insufficient documentation

## 2022-02-27 DIAGNOSIS — N921 Excessive and frequent menstruation with irregular cycle: Secondary | ICD-10-CM

## 2022-02-27 DIAGNOSIS — F32A Depression, unspecified: Secondary | ICD-10-CM | POA: Insufficient documentation

## 2022-02-27 HISTORY — DX: Gastroduodenitis, unspecified, without bleeding: K29.90

## 2022-02-27 HISTORY — DX: Bipolar disorder, current episode depressed, mild: F31.31

## 2022-02-27 HISTORY — DX: Bitten or stung by nonvenomous insect and other nonvenomous arthropods, initial encounter: W57.XXXA

## 2022-02-27 HISTORY — DX: Mixed hyperlipidemia: E78.2

## 2022-02-27 HISTORY — DX: COVID-19: U07.1

## 2022-02-27 HISTORY — DX: Excessive and frequent menstruation with irregular cycle: N92.1

## 2022-02-27 HISTORY — DX: Tobacco use: Z72.0

## 2022-02-27 HISTORY — DX: Tobacco abuse counseling: Z71.6

## 2022-02-27 HISTORY — DX: Acute upper respiratory infection, unspecified: J06.9

## 2022-02-27 HISTORY — DX: Allergic rhinitis due to pollen: J30.1

## 2022-02-27 HISTORY — DX: Irregular menstruation, unspecified: N92.6

## 2022-02-27 HISTORY — DX: Other vitamin B12 deficiency anemias: D51.8

## 2022-02-27 HISTORY — DX: Paroxysmal tachycardia, unspecified: I47.9

## 2022-02-27 HISTORY — DX: Post-traumatic stress disorder, unspecified: F43.10

## 2022-02-27 HISTORY — DX: Panic disorder (episodic paroxysmal anxiety): F41.0

## 2022-02-27 HISTORY — DX: Attention and concentration deficit: R41.840

## 2022-02-27 HISTORY — DX: Hypothyroidism, unspecified: E03.9

## 2022-02-27 HISTORY — DX: Encounter for screening for respiratory tuberculosis: Z11.1

## 2022-02-27 HISTORY — DX: Type 2 diabetes mellitus without complications: E11.9

## 2022-02-27 HISTORY — DX: Herpesviral infection of urogenital system, unspecified: A60.00

## 2022-02-27 HISTORY — DX: Homicidal ideations: R45.850

## 2022-02-27 HISTORY — DX: Encounter for general adult medical examination without abnormal findings: Z00.00

## 2022-02-27 HISTORY — DX: Fever, unspecified: R50.9

## 2022-03-06 ENCOUNTER — Other Ambulatory Visit (INDEPENDENT_AMBULATORY_CARE_PROVIDER_SITE_OTHER): Payer: Commercial Managed Care - PPO

## 2022-03-06 ENCOUNTER — Encounter: Payer: Self-pay | Admitting: Gastroenterology

## 2022-03-06 ENCOUNTER — Ambulatory Visit: Payer: Commercial Managed Care - PPO | Admitting: Gastroenterology

## 2022-03-06 VITALS — BP 102/72 | HR 98 | Ht 62.0 in | Wt 182.5 lb

## 2022-03-06 DIAGNOSIS — E538 Deficiency of other specified B group vitamins: Secondary | ICD-10-CM | POA: Diagnosis not present

## 2022-03-06 DIAGNOSIS — K50919 Crohn's disease, unspecified, with unspecified complications: Secondary | ICD-10-CM | POA: Diagnosis not present

## 2022-03-06 LAB — COMPREHENSIVE METABOLIC PANEL
ALT: 13 U/L (ref 0–35)
AST: 14 U/L (ref 0–37)
Albumin: 4.3 g/dL (ref 3.5–5.2)
Alkaline Phosphatase: 54 U/L (ref 39–117)
BUN: 6 mg/dL (ref 6–23)
CO2: 25 mEq/L (ref 19–32)
Calcium: 9 mg/dL (ref 8.4–10.5)
Chloride: 104 mEq/L (ref 96–112)
Creatinine, Ser: 0.89 mg/dL (ref 0.40–1.20)
GFR: 82.03 mL/min (ref >60.00)
Glucose, Bld: 91 mg/dL (ref 70–99)
Potassium: 3.9 mEq/L (ref 3.5–5.1)
Sodium: 135 mEq/L (ref 135–145)
Total Bilirubin: 0.4 mg/dL (ref 0.2–1.2)
Total Protein: 7.5 g/dL (ref 6.0–8.3)

## 2022-03-06 LAB — CBC WITH DIFFERENTIAL/PLATELET
Basophils Absolute: 0 10*3/uL (ref 0.0–0.1)
Basophils Relative: 0.5 % (ref 0.0–3.0)
Eosinophils Absolute: 0.1 10*3/uL (ref 0.0–0.7)
Eosinophils Relative: 0.8 % (ref 0.0–5.0)
HCT: 40.5 % (ref 36.0–46.0)
Hemoglobin: 13.4 g/dL (ref 12.0–15.0)
Lymphocytes Relative: 30.1 % (ref 12.0–46.0)
Lymphs Abs: 2.5 10*3/uL (ref 0.7–4.0)
MCHC: 33.1 g/dL (ref 30.0–36.0)
MCV: 83.4 fl (ref 78.0–100.0)
Monocytes Absolute: 0.6 10*3/uL (ref 0.1–1.0)
Monocytes Relative: 6.7 % (ref 3.0–12.0)
Neutro Abs: 5.2 10*3/uL (ref 1.4–7.7)
Neutrophils Relative %: 61.9 % (ref 43.0–77.0)
Platelets: 310 10*3/uL (ref 150.0–400.0)
RBC: 4.85 Mil/uL (ref 3.87–5.11)
RDW: 14.6 % (ref 11.5–15.5)
WBC: 8.4 10*3/uL (ref 4.0–10.5)

## 2022-03-06 LAB — TSH: TSH: 1.26 u[IU]/mL (ref 0.35–5.50)

## 2022-03-06 LAB — C-REACTIVE PROTEIN: CRP: <1 mg/dL (ref 0.5–20.0)

## 2022-03-06 LAB — VITAMIN B12: Vitamin B-12: 258 pg/mL (ref 211–911)

## 2022-03-06 LAB — SEDIMENTATION RATE: Sed Rate: 17 mm/hr (ref 0–20)

## 2022-03-06 NOTE — Progress Notes (Signed)
Chief Complaint: FU  Referring Provider:  Zoila Shutter, NP      ASSESSMENT AND PLAN;   #1.  Crohn's disease with anastomotic recurrence on CT 12/2019. Started Humira Sep 03, 2021.   Dx 2005 at age 41 s/p R ileocolectomy (2005), with postoperative recurrance, moderate activity on colon 07/2015. Refused any meds for Crohn's d/t S/Es.  Self controlled on CBD oil.  Has associated IBS with diarrhea and element of postcholecystectomy diarrhea. Neg stool for GI pathogen, C diff 03/2021 with elevated calprotectin. Nl CRP. Neg TMPT 08/2021. S/P hep B vaccine.  #2.  Associated B12 deficiency. Last B12 was normal.  #3. Wt gain   Plan: - Continue Humira 56m SQ every other week. - Humira trough and Ab level. - CBC with diff, CMP, CRP, sed rate, B12, TSH - Encouraged her to watch calorie intake.  Avoid fatty foods.  Stop sodas.  Start exercising.  Fortunately, she is highly motivated and has joined the gym. - FU in 12 weeks. - Has appt with cardio/dermatology  HPI:    Monica Barker a 41y.o. female   For FU visit.   Started Humira 09/03/2021. No further abdo pain since Humira  Doing great from GI standpoint  Has appt with dermatology and cardiology  Currently having softer 4 Bms/day.  Mostly after eating.  No blood.  Eating much better.  Has gained weight as below.  She apparently got a letter stating that it could be because of Humira.  I did not find any definite side effects of weight gain.  She will get [place].  Previously CTE - attempted but she didn't tolerate contrast. Hence, not done.  She is eating much better.  Has gained wt. Has not been on steroids  Wt Readings from Last 3 Encounters:  03/06/22 182 lb 8 oz (82.8 kg)  09/25/21 165 lb 4 oz (75 kg)  08/20/21 162 lb 3.2 oz (73.6 kg)   No NSAIDs    Past GI procedures: Colonoscopy 04/18/2021 - Moderate to severe neoterminal ileitis, consistent with postoperative Crohn's recurrence. Bx- C/W ileitis. Neg  random colon biopsies - Diminutive colonic polyp s/p polypectomy. Neg Bx - No colonic or perianal involvement.  EGD 04/18/2021 - Minimal gastritis. No evidence of upper GI Crohn's disease. Bx- neg for HP - SB bx were negative for celiac disease.  CT AP 12/2019 Redemonstrated postoperative findings of terminal ileocolectomy and reanastomosis. There is new inflammatory bowel wall thickening of the most distal remaining 10 cm of ileum at the anastomosis. No evidence of complicating fistula, abscess, or stricture. No evident inflammatory involvement of the colon or rectum.    Past Medical History:  Diagnosis Date   ADHD (attention deficit hyperactivity disorder)    Bipolar disorder ([place]    Cro[place] disease ([place]    Dx 2005 at age [age]s/p R hemicolectomy, with postoperative recurrance, moderate activity on colonoscopy 08/08/2015   Depressive disorder    Dysphagia    Dysthymic disorder    IBS (irritable bowel syndrome)    IBS (irritable bowel syndrome)    with diarrhea   Skin cancer    TMJ (temporomandibular joint disorder)     Past Surgical History:  Procedure Laterality Date   APPENDECTOMY     CHOLECYSTECTOMY  2014   COLON SURGERY  05/2004   due to [place] 05/2004   COLONOSCOPY  09/02/2017   Recurrance of Crohn's disease in the neo terminal ileum (biopsied)- mild.  Minimal rectal erythema (? importance-biopsed). No evidence of peranal Crohn's disease. Status post ileocolectomy.    COLONOSCOPY  09/02/2017   Recurrance of Crohn's Disease in the neo terminal ileum (biopsed)-mild. Minimal rectal erythema (? Importance-biposed) No Evidence of peranal crohn's disease. Status post ileccolectomy.   DILATION AND CURETT[facility]TERUS  11/2021   [facility]   ESOPHAGOGASTRODUODENOSCOPY  10/03/2013   Mild gastritis. Normal EGD   ESOPHAGOGASTRODUODENOSCOPY  10/03/2013   Mild Gastritis. Normal EGD    Family History  Problem Relation Age of Onset   Skin cancer Father     COPD Father    Gout Father    Arthritis Father    Hypertension Father    Diabetes Brother    Drug abuse Brother    Colon cancer Paternal Aunt        Paternal mom's sister/PGM's sister   Breast cancer Maternal Grandmother        happened twice/x2   Skin cancer Paternal Grandmother    Endometriosis Sister    ADD / ADHD Son    Healthy Son    Esophageal cancer Neg Hx     Social History   Tobacco Use   Smoking status: Former   Smokeless tobacco: Never   Tobacco comments:    09/2019  Vaping Use   Vaping Use: Former   Devices: occasionally   Substance Use Topics   Alcohol use: Not Currently   Drug use: Not Currently    Current Outpatient Medications  Medication Sig Dispense Refill   acyclovir (ZOVIRAX) 800 MG tablet Take 800 mg by mouth 3 (three) times daily.     Adalimumab (HUMIRA PEN-CD/UC/HS STARTER) 80 MG/0.8ML PNKT Inject SQ 160 mg day 1 then inject 80 mg SQ day 15 then begin maintenance dose 1 each 0   ALPRAZolam (XANAX) 0.5 MG tablet Take 0.5 mg by mouth 3 (three) times daily as needed for anxiety.     cyanocobalamin (,VITAMIN B-12,) 1000 MCG/ML injection INJECT 1 ML (1,000 MCG TOTAL) INTO THE MUSCLE EVERY 30 (THIRTY) DAYS. 1 mL 12   Multiple Vitamin (MULTIVITAMIN) capsule Take 1 capsule by mouth daily.     pantoprazole (PROTONIX) 40 MG tablet Take 1 tablet (40 mg total) by mouth daily. 90 tablet 3   VRAYLAR 1.5 MG capsule Take 1.5 mg by mouth daily.     VYVANSE 20 MG capsule Take 20 mg by mouth every morning.     No current facility-administered medications for this visit.    Allergies  Allergen Reactions   Ativan [Lorazepam]     intesitided her anxiety    Latex    Latex Other (See Comments)    unknown    Review of Systems:  neg     Physical Exam:    BP 102/72   Pulse 98   Ht 5' 2"  (1.575 m)   Wt 182 lb 8 oz (82.8 kg)   BMI 33.38 kg/m  Filed Weights   03/06/22 1448  Weight: 182 lb 8 oz (82.8 kg)   Gen: awake, alert, NAD HEENT: anicteric,  no pallor CV: RRR, no mrg Pulm: CTA b/l Abd: soft, NT/ND, +BS throughout Ext: no c/c/e Neuro: nonfocal   Data Reviewed: I have personally reviewed following labs and imaging studies  CBC:    Latest Ref Rng & Units 08/19/2021    2:59 PM 03/01/2021   12:41 PM 12/27/2019   12:50 PM  CBC  WBC 4.0 - 10.5 K/uL 6.8   7.5   6.1  Hemoglobin 12.0 - 15.0 g/dL 12.3   12.1   13.4    Hematocrit 36.0 - 46.0 % 38.1   36.7   40.2    Platelets 150.0 - 400.0 K/uL 290.0   304.0   272.0      CMP:    Latest Ref Rng & Units 08/19/2021    2:59 PM 07/30/2021    9:43 AM 03/01/2021   12:41 PM  CMP  Glucose 70 - 99 mg/dL 80    89    BUN 6 - 23 mg/dL 7    9    Creatinine 0.40 - 1.20 mg/dL 0.72    0.71    Sodium 135 - 145 mEq/L 141    139    Potassium 3.5 - 5.1 mEq/L 4.1    3.9    Chloride 96 - 112 mEq/L 107    104    CO2 19 - 32 mEq/L 28    28    Calcium 8.4 - 10.5 mg/dL 8.9    8.7    Total Protein 6.0 - 8.3 g/dL 6.7   6.9   6.7    Total Bilirubin 0.2 - 1.2 mg/dL 0.3    0.2    Alkaline Phos 39 - 117 U/L 45    54    AST 0 - 37 U/L 12    12    ALT 0 - 35 U/L 10    12          Carmell Austria, MD 03/06/2022, 3:08 PM  Cc: Zoila Shutter, NP

## 2022-03-06 NOTE — Patient Instructions (Signed)
If you are age 39 or older, your body mass index should be between 23-30. Your Body mass index is 33.38 kg/m. If this is out of the aforementioned range listed, please consider follow up with your Primary Care Provider.  If you are age 39 or younger, your body mass index should be between 19-25. Your Body mass index is 33.38 kg/m. If this is out of the aformentioned range listed, please consider follow up with your Primary Care Provider.   ________________________________________________________  The [facility] providers would like to encourage you to use [facility] to communicate with providers for non-urgent requests or questions.  Due to long hold times on the telephone, sending your provider a message by [facility] may be a faster and more efficient way to get a response.  Please allow 48 business hours for a response.  Please remember that this is for non-urgent requests.  _______________________________________________________  Your provider has requested that you go to the basement level for lab work before leaving today. Press "B" on the elevator. The lab is located at the first door on the left as you exit the elevator.  Continue humira  Please call in 12 weeks to schedule a follow up appointment.  Thank you,  Dr. Jackquline Denmark

## 2022-03-12 ENCOUNTER — Ambulatory Visit: Payer: Commercial Managed Care - PPO | Admitting: Cardiology

## 2022-03-14 ENCOUNTER — Encounter: Payer: Self-pay | Admitting: Gastroenterology

## 2022-03-14 LAB — ADALIMUMAB+AB (SERIAL MONITOR)
Adalimumab Drug Level: 1.4 ug/mL
Anti-Adalimumab Antibody: 389 ng/mL

## 2022-03-14 LAB — SERIAL MONITORING

## 2022-03-24 ENCOUNTER — Other Ambulatory Visit: Payer: Self-pay

## 2022-03-26 ENCOUNTER — Ambulatory Visit (INDEPENDENT_AMBULATORY_CARE_PROVIDER_SITE_OTHER): Payer: Commercial Managed Care - PPO

## 2022-03-26 ENCOUNTER — Ambulatory Visit: Payer: Commercial Managed Care - PPO | Admitting: Cardiology

## 2022-03-26 ENCOUNTER — Encounter: Payer: Self-pay | Admitting: Cardiology

## 2022-03-26 VITALS — BP 102/74 | HR 84 | Ht 63.0 in | Wt 183.4 lb

## 2022-03-26 DIAGNOSIS — E66811 Obesity, class 1: Secondary | ICD-10-CM

## 2022-03-26 DIAGNOSIS — R002 Palpitations: Secondary | ICD-10-CM

## 2022-03-26 DIAGNOSIS — I479 Paroxysmal tachycardia, unspecified: Secondary | ICD-10-CM

## 2022-03-26 DIAGNOSIS — R011 Cardiac murmur, unspecified: Secondary | ICD-10-CM

## 2022-03-26 DIAGNOSIS — E669 Obesity, unspecified: Secondary | ICD-10-CM

## 2022-03-26 DIAGNOSIS — I10 Essential (primary) hypertension: Secondary | ICD-10-CM

## 2022-03-26 HISTORY — DX: Cardiac murmur, unspecified: R01.1

## 2022-03-26 HISTORY — DX: Obesity, unspecified: E66.9

## 2022-03-26 HISTORY — DX: Obesity, class 1: E66.811

## 2022-03-26 NOTE — Progress Notes (Signed)
Cardiology Office Note:    Date:  03/26/2022   ID:  Monica Barker, DOB 08-28-1981, MRN [id number]  PCP:  Zoila Shutter, NP  Cardiologist:  Jenean Lindau, MD   Referring MD: Zoila Shutter, NP    ASSESSMENT:    1. Essential hypertension   2. Paroxysmal tachycardia (HCC)   3. Obesity (BMI 30.0-34.9)   4. Palpitations   5. Cardiac murmur    PLAN:    In order of problems listed above:  Primary prevention stressed with the patient.  Importance of compliance with diet medication stressed and she vocalized understanding.  She exercises on a regular basis. Dyspnea on exertion: I am concerned about her symptoms.  She has not much in terms of risk factors for coronary artery disease..  We will do a Chem-7 and D-dimer [place] [facility] and decide plan accordingly. Cardiac murmur: Echocardiogram will be done to assess murmur heard on auscultation. Palpitations: I reviewed blood work.  We will do a 2-week monitor to assess this and address it accordingly based on her findings. Obesity: Weight reduction stressed and diet was emphasized.  Lifestyle modification urged. Tobacco abuse: She was smoking but quit and is now vaping and I told her to quit this.  Patient will be seen in follow-up appointment in 6 months or earlier if the patient has any concerns    Medication Adjustments/Labs and Tests Ordered: Current medicines are reviewed at length with the patient today.  Concerns regarding medicines are outlined above.  Orders Placed This Encounter  Procedures   LONG TERM MONITOR (3-14 DAYS)   EKG 12-Lead   ECHOCARDIOGRAM COMPLETE   No orders of the defined types were placed in this encounter.    History of Present Illness:    Monica Barker is a 41 y.o. female who is being seen today for the evaluation of palpitations and dyspnea on exertion at the request of Zoila Shutter, NP.  Patient is a pleasant 41 year old female.  She has past medical history that is not much significant.  She  mentions to me that she has been having palpitations on and off and this is a significant lifestyle issue for her.  She denies any chest pain orthopnea or PND.  At the time of my evaluation, the patient is alert awake oriented and in no distress.  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  X-rays were unremarkable except for mild chondromalacia patella of the left knee joint   Chronic fatigue syndrome 08/20/2021   COVID-19 virus infection    11/22/2019 tested positive   COVID-19 virus infection 02/27/2022   11/22/2019 tested positive   Crohn disease ([place])    Dx 2005 at age 75 s/p R hemicolectomy, with postoperative recurrance, moderate activity on colonoscopy 08/08/2015   Depressive disorder    Dysphagia    Dysthymic disorder    Encounter for general adult medical examination without abnormal findings 02/27/2022   Essential hypertension 08/20/2021   Excessive and frequent menstruation with irregular cycle 02/27/2022   Fever 02/27/2022   GAD (generalized anxiety disorder) 08/20/2021  Gastroduodenitis 02/27/2022   Genital herpes simplex 02/27/2022   History of Crohn's disease 08/20/2021   History of hyperlipidemia 08/20/2021   History of hypothyroidism 08/20/2021   History of type 2 diabetes mellitus 08/20/2021   Homicidal ideations 02/27/2022   Hypothyroidism 02/27/2022   IBS (irritable bowel syndrome)    IBS (irritable bowel syndrome)    with diarrhea   Insomnia 08/20/2021   Irregular menstruation 02/27/2022   Mixed hyperlipidemia 02/27/2022   Other  vitamin B12 deficiency anemias 02/27/2022   Palpitations 07/27/2016   Panic disorder 02/27/2022   Paroxysmal tachycardia ([place]) 02/27/2022   Posttraumatic stress disorder 02/27/2022   Primary osteoarthritis of both feet 08/20/2021   Primary osteoarthritis of both hands 08/20/2021   Clinical and radiographic findings are consistent with osteoarthritis.   Skin cancer    TMJ (temporomandibular joint disorder)    Tobacco abuse counseling 02/27/2022   Tobacco use 02/27/2022   Tuberculosis screening 02/27/2022   Type 2 diabetes mellitus without complications ([place]) 4/66/5993   Vitamin B12 deficiency 08/20/2021   Vitamin D deficiency 08/20/2021    Past Surgical History:  Procedure Laterality Date   APPENDECTOMY     CHOLECYSTECTOMY  2014   COLON SURGERY  05/2004   due to [place]  05/2004   COLONOSCOPY  09/02/2017   Recurrance of Crohn's disease in the neo terminal ileum (biopsied)- mild. Minimal rectal erythema (? importance-biopsed). No evidence of peranal Crohn's disease. Status post ileocolectomy.    COLONOSCOPY  09/02/2017   Recurrance of Crohn's Disease in the neo terminal ileum (biopsed)-mild. Minimal rectal erythema (? Importance-biposed) No Evidence of peranal crohn's disease. Status post ileccolectomy.   DILATION AND CURETTAGE OF UTERUS  11/2021   [facility]   ESOPHAGOGASTRODUODENOSCOPY  10/03/2013   Mild gastritis. Normal EGD   ESOPHAGOGASTRODUODENOSCOPY  10/03/2013   Mild Gastritis. Normal EGD    Current Medications: Current Meds  Medication Sig   acyclovir (ZOVIRAX) 800 MG tablet Take 800 mg by mouth 3 (three) times daily.   ALPRAZolam (XANAX) 0.5 MG tablet Take 0.5 mg by mouth 3 (three) times daily as needed for anxiety.   cyanocobalamin (,VITAMIN B-12,) 1000 MCG/ML injection INJECT 1 ML (1,000 MCG TOTAL) INTO THE MUSCLE EVERY 30 (THIRTY) DAYS.   HUMIRA PEN 40 MG/0.4ML PNKT Inject 40 mg into the skin every 14 (fourteen) days.   Multiple Vitamin (MULTIVITAMIN)  capsule Take 1 capsule by mouth daily.   pantoprazole (PROTONIX) 40 MG tablet Take 1 tablet (40 mg total) by mouth daily.   VRAYLAR 1.5 MG capsule Take 1.5 mg by mouth daily.   VYVANSE 20 MG capsule Take 20 mg by mouth every morning.     Allergies:   Ativan [lorazepam], Latex, and Latex   Social History   Socioeconomic History   Marital status: Single    Spouse name: Not on file   Number of children: Not on file   Years of education: Not on file   Highest education level: Not on file  Occupational History   Not on file  Tobacco Use   Smoking status: Former   Smokeless tobacco: Never   Tobacco comments:    09/2019  Vaping Use   Vaping Use: Former   Devices: occasionally   Substance and Sexual Activity   Alcohol use: Not Currently   Drug use: Not Currently   Sexual activity: Not on file  Other Topics Concern   Not on file  Social History Narrative   ** Merged History Encounter **  Social Determinants of Health   Financial Resource Strain: Not on file  Food Insecurity: Not on file  Transportation Needs: Not on file  Physical Activity: Not on file  Stress: Not on file  Social Connections: Not on file     Family History: The patient's family history includes ADD / ADHD in her son; Arthritis in her father; Breast cancer in her maternal grandmother; COPD in her father; Colon cancer in her paternal aunt; Diabetes in her brother; Drug abuse in her brother; Endometriosis in her sister; Gout in her father; Healthy in her son; Hypertension in her father; Skin cancer in her father and paternal grandmother. There is no history of Esophageal cancer.  ROS:   Please see the history of present illness.    All other systems reviewed and are negative.  EKGs/Labs/Other Studies Reviewed:    The following studies were reviewed today: EKG reveals sinus rhythm and nonspecific ST-T changes   Recent Labs: 03/06/2022: ALT 13; BUN 6; Creatinine, Ser 0.89; Hemoglobin 13.4; Platelets  310.0; Potassium 3.9; Sodium 135; TSH 1.26  Recent Lipid Panel No results found for: CHOL, TRIG, HDL, CHOLHDL, VLDL, LDLCALC, LDLDIRECT  Physical Exam:    VS:  BP 102/74   Pulse 84   Ht 5' 3"  (1.6 m)   Wt 183 lb 6.4 oz (83.2 kg)   SpO2 99%   BMI 32.49 kg/m     Wt Readings from Last 3 Encounters:  03/26/22 183 lb 6.4 oz (83.2 kg)  03/06/22 182 lb 8 oz (82.8 kg)  09/25/21 165 lb 4 oz (75 kg)     GEN: Patient is in no acute distress HEENT: Normal NECK: No JVD; No carotid bruits LYMPHATICS: No lymphadenopathy CARDIAC: S1 S2 regular, 2/6 systolic murmur at the apex. RESPIRATORY:  Clear to auscultation without rales, wheezing or rhonchi  ABDOMEN: Soft, non-tender, non-distended MUSCULOSKELETAL:  No edema; No deformity  SKIN: Warm and dry NEUROLOGIC:  Alert and oriented x 3 PSYCHIATRIC:  Normal affect    Signed, Jenean Lindau, MD  03/26/2022 9:29 AM    [facility]

## 2022-03-26 NOTE — Patient Instructions (Signed)
Medication Instructions:  Your physician recommends that you continue on your current medications as directed. Please refer to the Current Medication list given to you today.  *If you need a refill on your cardiac medications before your next appointment, please call your pharmacy*   Lab Work: Your physician recommends that you return for lab work in: To be done today at [facility]  If you have labs (blood work) drawn today and your tests are completely normal, you will receive your results only by: [place] (if you have MyChart) OR A paper copy in the mail If you have any lab test that is abnormal or we need to change your treatment, we will call you to review the results.   Testing/Procedures: Your physician has requested that you have an echocardiogram. Echocardiography is a painless test that uses sound waves to create images of your heart. It provides your doctor with information about the size and shape of your heart and how well your heart's chambers and valves are working. This procedure takes approximately one hour. There are no restrictions for this procedure.   You have been asked to wear a Zio Heart Monitor today. It is to be worn for  14   days. Please remove the monitor on  04/09/22      and mail back in the box provided.  If you have any questions about the monitor please call the company at [phone]     Follow-Up: At [facility], you and your health needs are our priority.  As part of our continuing mission to provide you with exceptional heart care, we have created designated Provider Care Teams.  These Care Teams include your primary Cardiologist (physician) and Advanced Practice Providers (APPs -  Physician Assistants and Nurse Practitioners) who all work together to provide you with the care you need, when you need it.  We recommend signing up for the patient portal called "MyChart".  Sign up information is provided on this After Visit  Summary.  MyChart is used to connect with patients for Virtual Visits (Telemedicine).  Patients are able to view lab/test results, encounter notes, upcoming appointments, etc.  Non-urgent messages can be sent to your provider as well.   To learn more about what you can do with MyChart, go to [email or web address].    Your next appointment:   1 month(s)  The format for your next appointment:   In Person  Provider:   Jyl Heinz, MD    Other Instructions   Important Information About Sugar

## 2022-04-01 ENCOUNTER — Ambulatory Visit (INDEPENDENT_AMBULATORY_CARE_PROVIDER_SITE_OTHER): Payer: Commercial Managed Care - PPO

## 2022-04-01 DIAGNOSIS — R011 Cardiac murmur, unspecified: Secondary | ICD-10-CM

## 2022-04-01 LAB — ECHOCARDIOGRAM COMPLETE
Area-P 1/2: 2.66 cm2
S' Lateral: 2.9 cm

## 2022-04-04 ENCOUNTER — Telehealth: Payer: Self-pay

## 2022-04-04 NOTE — Telephone Encounter (Signed)
Results reviewed with pt as per Dr. Julien Nordmann note.  Pt verbalized understanding and had no additional questions. Routed to PCP.

## 2022-04-09 ENCOUNTER — Telehealth: Payer: Self-pay | Admitting: Gastroenterology

## 2022-04-09 NOTE — Telephone Encounter (Signed)
Patient has been rescheduled for 7-20 as of now but she is questioning about her humira if she should still take it. Can you please advise if she needs to be seen sooner to discuss her recent lab work results?

## 2022-04-09 NOTE — Telephone Encounter (Signed)
Inbound call from patient inquiring if she should continue taking her Hummira. Please give patient a call back to advise

## 2022-04-10 ENCOUNTER — Ambulatory Visit: Payer: Commercial Managed Care - PPO | Admitting: Gastroenterology

## 2022-04-11 NOTE — Telephone Encounter (Signed)
Patient returned call, I advised that she keep appointment and if she starts to have issues for her to give [place] a call. Patient is requesting a call back on Monday. Please advise.

## 2022-04-11 NOTE — Telephone Encounter (Signed)
Monica Barker can you follow up on this please?

## 2022-04-14 NOTE — Telephone Encounter (Signed)
Spoke with pt and let her know Dr. Urban Gibson comments on her drug level that stated to continue taking Humira until OV.

## 2022-04-16 ENCOUNTER — Encounter: Payer: Self-pay | Admitting: Cardiology

## 2022-04-23 ENCOUNTER — Ambulatory Visit: Payer: Commercial Managed Care - PPO | Admitting: Cardiology

## 2022-04-23 ENCOUNTER — Other Ambulatory Visit: Payer: Self-pay

## 2022-05-08 ENCOUNTER — Ambulatory Visit (INDEPENDENT_AMBULATORY_CARE_PROVIDER_SITE_OTHER): Payer: Commercial Managed Care - PPO | Admitting: Gastroenterology

## 2022-05-08 ENCOUNTER — Encounter: Payer: Self-pay | Admitting: Gastroenterology

## 2022-05-08 VITALS — BP 112/78 | HR 94 | Ht 63.0 in | Wt 182.4 lb

## 2022-05-08 DIAGNOSIS — E538 Deficiency of other specified B group vitamins: Secondary | ICD-10-CM | POA: Diagnosis not present

## 2022-05-08 DIAGNOSIS — K50919 Crohn's disease, unspecified, with unspecified complications: Secondary | ICD-10-CM

## 2022-05-08 NOTE — Progress Notes (Signed)
Chief Complaint: FU  Referring Provider:  Zoila Shutter, NP      ASSESSMENT AND PLAN;   #1.  Crohn's disease with anastomotic recurrence on CT 12/2019. Started Humira Sep 03, 2021.  Dx at age 41 s/p R ileocolectomy (2005), with postoperative recurrance, moderate activity on colon 07/2015. Refused any meds for Crohn's d/t S/Es.  Self controlled on CBD oil.  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 Started Humira 09/03/2021 with good relief. Lost response to humira (+Ab/low level). Neg TB gold and HBsAg. S/P Hep B vaccine.  #2.  Associated B12 deficiency. Last B12 was normal.  #3. Wt gain   Plan: -Change Humira to  Entiyo 368m (week 0, 2, 6) then 3023mQ8 weeks. -Shingles vaccines (Shringix) -FU in 6 months  HPI:    [name] a [age].o. female   For FU visit.   Started Humira 09/03/2021..  Did very well thereafter until 3 months ago.  Has been having intermittent abdominal pain associated with diarrhea.  Rare rectal bleeding.  She denies having any nausea or vomiting.  Her labs including CBC, CMP, CRP has been normal.  Sed rate has jumped from 2  to 17. Unfortunately, she has developed anti-Humira antibodies with low Humira levels.  Currently having softer 4 Bms/day.  Mostly after eating.  No blood.  Her cardiac work-up has been negative including negative 2D echo  She has dermatology appointment coming up.  Previously CTE - attempted but she didn't tolerate contrast. Hence, not done.  No weight loss.  Wt Readings from Last 3 Encounters:  05/08/22 182 lb 6 oz (82.7 kg)  03/26/22 183 lb 6.4 oz (83.2 kg)  03/06/22 182 lb 8 oz (82.8 kg)   No NSAIDs    Past GI procedures: Colonoscopy 04/18/2021 - Moderate to severe neoterminal ileitis, consistent with postoperative Crohn's recurrence. Bx- C/W ileitis. Neg random colon biopsies - Diminutive colonic polyp s/p polypectomy. Neg Bx - No  colonic or perianal involvement.  EGD 04/18/2021 - Minimal gastritis. No evidence of upper GI Crohn's disease. Bx- neg for HP - SB bx were negative for celiac disease.  CT AP 12/2019 Redemonstrated postoperative findings of terminal ileocolectomy and reanastomosis. There is new inflammatory bowel wall thickening of the most distal remaining 10 cm of ileum at the anastomosis. No evidence of complicating fistula, abscess, or stricture. No evident inflammatory involvement of the colon or rectum.    Past Medical History:  Diagnosis Date   ADHD (attention deficit hyperactivity disorder)    [place]   [place] ([facility] [place]   Dx 2005 at age 41/p R hemicolectomy, with postoperative recurrance, moderate activity on colonoscopy 08/08/2015   Depressive disorder    Dysphagia    Dysthymic disorder    IBS (irritable bowel syndrome)    IBS (irritable bowel syndrome)    with diarrhea   Skin cancer    TMJ (temporomandibular joint disorder)     Past Surgical History:  Procedure Laterality Date   APPENDECTOMY     CHOLECYSTECTOMY  2014   COLON SURGERY  05/2004   [place]   COLONOSCOPY  09/02/2017   Recurrance of Crohn's disease in the neo terminal ileum (biopsied)- mild. Minimal rectal erythema (? importance-biopsed). No evidence of peranal Crohn's disease. Status post ileocolectomy.    COLONOSCOPY  09/02/2017   Recurrance of Crohn's Disease in the neo terminal ileum (  biopsed)-mild. Minimal rectal erythema (? Importance-biposed) No Evidence of peranal crohn's disease. Status post ileccolectomy.   DILATION AND CURETTAGE OF UTERUS  11/2021   [facility]   ESOPHAGOGASTRODUODENOSCOPY  10/03/2013   Mild gastritis. Normal EGD   ESOPHAGOGASTRODUODENOSCOPY  10/03/2013   Mild Gastritis. Normal EGD    Family History  Problem Relation Age of Onset   Skin cancer Father    COPD Father    Gout Father    Arthritis Father    Hypertension Father     Diabetes Brother    Drug abuse Brother    Colon cancer Paternal Aunt        Paternal mom's sister/PGM's sister   Breast cancer Maternal Grandmother        happened twice/x2   Skin cancer Paternal Grandmother    Endometriosis Sister    ADD / ADHD Son    Healthy Son    Esophageal cancer Neg Hx     Social History   Tobacco Use   Smoking status: Former   Smokeless tobacco: Never   Tobacco comments:    09/2019  Vaping Use   Vaping Use: Former   Devices: occasionally   Substance Use Topics   Alcohol use: Not Currently   Drug use: Not Currently    Current Outpatient Medications  Medication Sig Dispense Refill   acyclovir (ZOVIRAX) 800 MG tablet Take 800 mg by mouth 3 (three) times daily.     ALPRAZolam (XANAX) 0.5 MG tablet Take 0.5 mg by mouth 3 (three) times daily as needed for anxiety.     cyanocobalamin (,VITAMIN B-12,) 1000 MCG/ML injection INJECT 1 ML (1,000 MCG TOTAL) INTO THE MUSCLE EVERY 30 (THIRTY) DAYS. 1 mL 12   HUMIRA PEN 40 MG/0.4ML PNKT Inject 40 mg into the skin every 14 (fourteen) days.     Multiple Vitamin (MULTIVITAMIN) capsule Take 1 capsule by mouth daily.     nicotine (NICODERM CQ - DOSED IN MG/24 HOURS) 14 mg/24hr patch Place 14 mg onto the skin daily.     pantoprazole (PROTONIX) 40 MG tablet Take 1 tablet (40 mg total) by mouth daily. 90 tablet 3   VRAYLAR 1.5 MG capsule Take 1.5 mg by mouth daily.     VYVANSE 20 MG capsule Take 20 mg by mouth every morning.     No current facility-administered medications for this visit.    Allergies  Allergen Reactions   Ativan [Lorazepam]     intesitided her anxiety    Latex    Latex Other (See Comments)    unknown    Review of Systems:  neg     Physical Exam:    BP 112/78   Pulse 94   Ht 5' 3"  (1.6 m)   Wt 182 lb 6 oz (82.7 kg)   SpO2 98%   BMI 32.31 kg/m  Filed Weights   05/08/22 0942  Weight: 182 lb 6 oz (82.7 kg)   Gen: awake, alert, NAD HEENT: anicteric, no pallor CV: RRR, no  mrg Pulm: CTA b/l Abd: soft, NT/ND, +BS throughout Ext: no c/c/e Neuro: nonfocal   Data Reviewed: I have personally reviewed following labs and imaging studies  CBC:    Latest Ref Rng & Units 03/06/2022    3:33 PM 08/19/2021    2:59 PM 03/01/2021   12:41 PM  CBC  WBC 4.0 - 10.5 K/uL 8.4  6.8  7.5   Hemoglobin 12.0 - 15.0 g/dL 13.4  12.3  12.1   Hematocrit 36.0 - 46.0 %  40.5  38.1  36.7   Platelets 150.0 - 400.0 K/uL 310.0  290.0  304.0     CMP:    Latest Ref Rng & Units 03/06/2022    3:33 PM 08/19/2021    2:59 PM 07/30/2021    9:43 AM  CMP  Glucose 70 - 99 mg/dL 91  80    BUN 6 - 23 mg/dL 6  7    Creatinine 0.40 - 1.20 mg/dL 0.89  0.72    Sodium 135 - 145 mEq/L 135  141    Potassium 3.5 - 5.1 mEq/L 3.9  4.1    Chloride 96 - 112 mEq/L 104  107    CO2 19 - 32 mEq/L 25  28    Calcium 8.4 - 10.5 mg/dL 9.0  8.9    Total Protein 6.0 - 8.3 g/dL 7.5  6.7  6.9   Total Bilirubin 0.2 - 1.2 mg/dL 0.4  0.3    Alkaline Phos 39 - 117 U/L 54  45    AST 0 - 37 U/L 14  12    ALT 0 - 35 U/L 13  10          Carmell Austria, MD 05/08/2022, 9:58 AM  Cc: Zoila Shutter, NP

## 2022-05-08 NOTE — Patient Instructions (Addendum)
If you are age 39 or older, your body mass index should be between 23-30. Your Body mass index is 32.31 kg/m. If this is out of the aforementioned range listed, please consider follow up with your Primary Care Provider.  If you are age 4 or younger, your body mass index should be between 19-25. Your Body mass index is 32.31 kg/m. If this is out of the aformentioned range listed, please consider follow up with your Primary Care Provider.   ________________________________________________________  The [facility] providers would like to encourage you to use [facility] to communicate with providers for non-urgent requests or questions.  Due to long hold times on the telephone, sending your provider a message by [facility] may be a faster and more efficient way to get a response.  Please allow 48 business hours for a response.  Please remember that this is for non-urgent requests.  _______________________________________________________   Please call back in 2 weeks and speak to the nurse regarding changing your medication to [facility]  Please contact your PCP regarding how to get the shingle vaccine.  Thank you,  Dr. Jackquline Denmark

## 2022-05-09 ENCOUNTER — Telehealth: Payer: Self-pay

## 2022-05-09 ENCOUNTER — Other Ambulatory Visit: Payer: Self-pay

## 2022-05-09 NOTE — Telephone Encounter (Signed)
-----   Message from [place], [place] sent at 05/08/2022 10:12 AM EDT ----- Regarding: [facility] Dr Lyndel Safe wants this patient to be set up for Entyvio infusions. Please start this. Thank you!!

## 2022-05-09 NOTE — Telephone Encounter (Signed)
Plan was sent to [place]: Pt was sent a my chart message and to make aware:

## 2022-05-12 ENCOUNTER — Telehealth: Payer: Self-pay | Admitting: Pharmacy Technician

## 2022-05-12 NOTE — Telephone Encounter (Addendum)
Auth Submission: approved Payer: [employer] Medication & CPT/J Code(s) submitted: Entyvio (Vedolizumab) [id number] Route of submission (phone, fax, portal): PORTAL Auth type: Buy/Bill Units/visits requested: 300MG Reference number: CASE ID# [id number] Approval from: 05/12/22 to  08/19/22 at [facility]   Note: [place] medicaid [facility] termed 05/20/22 Rep: Shakerra-J 05/22/22 11:15    ENTYVIO CO-PAY CARD: APPROVED ID: [id number] GR: [id number] BIN: [id number] PCN: [id number]

## 2022-05-22 ENCOUNTER — Encounter: Payer: Self-pay | Admitting: Gastroenterology

## 2022-05-30 ENCOUNTER — Encounter: Payer: Self-pay | Admitting: Gastroenterology

## 2022-06-06 ENCOUNTER — Encounter: Payer: Self-pay | Admitting: Gastroenterology

## 2022-06-06 ENCOUNTER — Ambulatory Visit (INDEPENDENT_AMBULATORY_CARE_PROVIDER_SITE_OTHER): Payer: Medicaid Other

## 2022-06-06 VITALS — BP 111/76 | HR 82 | Temp 98.4°F | Resp 18 | Ht 63.0 in | Wt 176.4 lb

## 2022-06-06 DIAGNOSIS — K50919 Crohn's disease, unspecified, with unspecified complications: Secondary | ICD-10-CM

## 2022-06-06 MED ORDER — VEDOLIZUMAB 300 MG IV SOLR
300.0000 mg | Freq: Once | INTRAVENOUS | Status: AC
  Administered 2022-06-06: 300 mg via INTRAVENOUS
  Filled 2022-06-06: qty 5

## 2022-06-06 NOTE — Progress Notes (Signed)
Diagnosis: Crohn's Disease  Provider:  Marshell Garfinkel MD  Procedure: Infusion  IV Type: Peripheral, IV Location: R Antecubital  Entyvio (Vedolizumab), Dose: 300 mg  Infusion Start Time: 1509  Infusion Stop Time: 3507  Post Infusion IV Care: Observation period completed and Peripheral IV Discontinued  Discharge: Condition: Good, Destination: Home . AVS provided to patient.   Performed by:  Cleophus Molt, RN

## 2022-06-06 NOTE — Patient Instructions (Signed)
Vedolizumab Injection What is this medication? VEDOLIZUMAB (ve doe LIZ you mab) treats inflammatory bowel diseases. It works by decreasing inflammation in the digestive tract. This medicine may be used for other purposes; ask your health care provider or pharmacist if you have questions. COMMON BRAND NAME(S): Entyvio What should I tell my care team before I take this medication? They need to know if you have any of these conditions: Immune system problems Infection, such a tuberculosis (TB) or other bacterial, fungal, or viral infections Liver disease Recent or upcoming vaccine An unusual or allergic reaction to vedolizumab, other medications, foods, dyes, or preservatives Pregnant or trying to get pregnant Breast-feeding How should I use this medication? This medication is injected into a vein. It is given by your care team in a hospital or clinic setting. A special MedGuide will be given to you by the pharmacist with each prescription and refill. Be sure to read this information carefully each time. Talk to your care team about the use of this medication in children. It is not approved for use in children. Overdosage: If you think you have taken too much of this medicine contact a poison control center or emergency room at once. NOTE: This medicine is only for you. Do not share this medicine with others. What if I miss a dose? It is important not to miss your dose. Call your care team if you are unable to keep an appointment. What may interact with this medication? Steroid medications, such as prednisone or cortisone TNF-alpha inhibitors, such as natalizumab, adalimumab, and infliximab Vaccines This list may not describe all possible interactions. Give your health care provider a list of all the medicines, herbs, non-prescription drugs, or dietary supplements you use. Also tell them if you smoke, drink alcohol, or use illegal drugs. Some items may interact with your medicine. What should  I watch for while using this medication? Visit your care team for regular checks on your progress. Tell your care team if your symptoms do not start to get better or if they get worse. Stay away from people who are sick. Call your care team for advice if you get a fever, chills or sore throat, or other symptoms of a cold or flu. Do not treat yourself. In some patients, this medication may cause a serious brain infection that may cause death. If you have any problems seeing, thinking, speaking, walking, or standing, tell your care team right away. If you cannot reach your care team, get urgent medical care. What side effects may I notice from receiving this medication? Side effects that you should report to your care team as soon as possible: Allergic reactions--skin rash, itching, hives, swelling of the face, lips, tongue, or throat Dizziness, loss of balance or coordination, confusion or trouble speaking Infection--fever, chills, cough, or sore throat Infusion reactions--chest pain, shortness of breath or trouble breathing, feeling faint or lightheaded Liver injury--right upper belly pain, loss of appetite, nausea, light-colored stool, dark yellow or brown urine, yellowing skin or eyes, unusual weakness or fatigue Side effects that usually do not require medical attention (report to your care team if they continue or are bothersome): Headache Fatigue Joint pain Nausea This list may not describe all possible side effects. Call your doctor for medical advice about side effects. You may report side effects to FDA at 1-800-FDA-1088. Where should I keep my medication? This medication is given in a hospital or clinic and will not be stored at home. NOTE: This sheet is a summary. It  may not cover all possible information. If you have questions about this medicine, talk to your doctor, pharmacist, or health care provider.  2023 Elsevier/Gold Standard (2013-03-09 00:00:00)

## 2022-06-14 ENCOUNTER — Encounter: Payer: Self-pay | Admitting: Gastroenterology

## 2022-06-20 ENCOUNTER — Ambulatory Visit (INDEPENDENT_AMBULATORY_CARE_PROVIDER_SITE_OTHER): Payer: Medicaid Other | Admitting: *Deleted

## 2022-06-20 VITALS — BP 113/73 | HR 91 | Temp 98.1°F | Resp 18 | Ht 63.0 in | Wt 173.8 lb

## 2022-06-20 DIAGNOSIS — K50919 Crohn's disease, unspecified, with unspecified complications: Secondary | ICD-10-CM | POA: Diagnosis not present

## 2022-06-20 MED ORDER — VEDOLIZUMAB 300 MG IV SOLR
300.0000 mg | Freq: Once | INTRAVENOUS | Status: AC
  Administered 2022-06-20: 300 mg via INTRAVENOUS
  Filled 2022-06-20: qty 5

## 2022-06-20 NOTE — Progress Notes (Signed)
Diagnosis: Crohn's Disease  Provider:  Marshell Garfinkel MD  Procedure: Infusion  IV Type: Peripheral, IV Location: R Antecubital  Entyvio (Vedolizumab), Dose: 300 mg  Infusion Start Time: 7544 pm  Infusion Stop Time: 9201 pm  Post Infusion IV Care: Observation period completed and Peripheral IV Discontinued  Discharge: Condition: Good, Destination: Home . AVS provided to patient.   Performed by:  Oren Beckmann, RN

## 2022-07-01 ENCOUNTER — Telehealth: Payer: Self-pay | Admitting: Gastroenterology

## 2022-07-01 NOTE — Telephone Encounter (Signed)
Patient called states the Entyvio medication has her feeling nauseas, dizziness and said her B12 and Iron levels are low. Please advise

## 2022-07-02 NOTE — Telephone Encounter (Signed)
Pt states that she has recently been started on Entyvio infusions and has received two infusions thus far: Pt stated that since the onset of infusions that she has been feeling dizzy, cloudy headed, fatigue, and nausea: Pt stated that she went to her PCP and had labs drawn and that her B12, Iron, and D3 levels were all low:  PCP Irven Shelling NP office contacted and labs requested: Labs orders were faxed over: Copies were made and originals were sent to be scanned into our computer system:  Pt was made aware that Dr. Lyndel Safe is out of the office until next week: Pt states her next Entyvio infusion is in 4 weeks  Please advise

## 2022-07-07 ENCOUNTER — Telehealth: Payer: Self-pay | Admitting: Gastroenterology

## 2022-07-07 NOTE — Telephone Encounter (Signed)
Patient called states she is having a reaction from her medication. Requesting a call back as soon as possible.

## 2022-07-08 NOTE — Telephone Encounter (Signed)
Pt states that she has recently been started on Entyvio infusions and has received two infusions thus far: Pt stated that since the onset of infusions that she has been feeling dizzy, cloudy headed, fatigue, and nausea: Pt stated that she went to her PCP and had labs drawn and that her B12, Iron, and D3 levels were all low:  PCP Irven Shelling NP office contacted and labs requested: Labs orders were faxed over: Copies were made and originals were sent to be scanned into our computer system:  Pt was made aware that Dr. Lyndel Safe is out of the office until next week: Pt states her next Entyvio infusion is in 4 weeks  Please advise

## 2022-07-10 ENCOUNTER — Telehealth: Payer: Self-pay

## 2022-07-10 NOTE — Telephone Encounter (Signed)
Pt stated that she is still having nausea, weakness, fatigue.  Pt was scheduled for an office visit with Dr. Lyndel Safe on 08/06/2022 at 8:50 AM  Pt made aware Pt verbalized understanding with all questions answered.

## 2022-07-15 NOTE — Telephone Encounter (Signed)
Patient called in with complaints of nausea, dizziness, and fatigue that started when she began Entyvio infusions. She is due for her next infusion 07/18/22, and is wondering if she needs to proceed with infusion as scheduled. She had labs drawn with PCP office (they are scanned into epic) and was told her iron was low, and that she needed to get in contact with our office to see if these symptoms could be from the infusion vs low iron. Pt is currently scheduled for OV on 08/06/22 with Dr. Lyndel Safe. Will route to MD for further recommendations.

## 2022-07-15 NOTE — Telephone Encounter (Signed)
Patient would like a call back

## 2022-07-16 NOTE — Telephone Encounter (Signed)
Left message for pt to call back  °

## 2022-07-16 NOTE — Telephone Encounter (Signed)
Lets hold off on Entyvio  for now. Pl  work her into my clinic in 1-2 weeks  RG

## 2022-07-16 NOTE — Telephone Encounter (Signed)
Patient is returning your call.  

## 2022-07-16 NOTE — Telephone Encounter (Signed)
Pt was made aware of Dr. Lyndel Safe recommendations:  Pt scheduled for an office visit on 07/30/2022 at 9:50 AM: Pt made aware: Pt verbalized understanding with all questions answered.

## 2022-07-18 ENCOUNTER — Ambulatory Visit: Payer: Medicaid Other

## 2022-07-30 ENCOUNTER — Encounter: Payer: Self-pay | Admitting: Gastroenterology

## 2022-07-30 ENCOUNTER — Ambulatory Visit (INDEPENDENT_AMBULATORY_CARE_PROVIDER_SITE_OTHER): Payer: Medicaid Other | Admitting: Gastroenterology

## 2022-07-30 ENCOUNTER — Other Ambulatory Visit: Payer: Self-pay

## 2022-07-30 ENCOUNTER — Other Ambulatory Visit: Payer: Medicaid Other

## 2022-07-30 ENCOUNTER — Telehealth: Payer: Self-pay

## 2022-07-30 VITALS — BP 98/76 | HR 105 | Ht 63.0 in | Wt 165.0 lb

## 2022-07-30 DIAGNOSIS — E538 Deficiency of other specified B group vitamins: Secondary | ICD-10-CM

## 2022-07-30 DIAGNOSIS — D509 Iron deficiency anemia, unspecified: Secondary | ICD-10-CM | POA: Diagnosis not present

## 2022-07-30 DIAGNOSIS — K50919 Crohn's disease, unspecified, with unspecified complications: Secondary | ICD-10-CM | POA: Insufficient documentation

## 2022-07-30 MED ORDER — HEMOCYTE PLUS 106-1 MG PO CAPS
1.0000 | ORAL_CAPSULE | Freq: Every day | ORAL | 4 refills | Status: AC
Start: 2022-07-30 — End: ?

## 2022-07-30 NOTE — Progress Notes (Signed)
Chief Complaint: FU  Referring Provider:  Zoila Shutter, NP      ASSESSMENT AND PLAN;   #1.  Crohn's disease with anastomotic recurrence.  Dx at age 41 s/p R ileocolectomy (2005), with postoperative recurrance, moderate activity on colon 07/2015. Refused any meds for Crohn's d/t S/Es.  Self controlled on CBD oil.  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 Started Humira 09/03/2021 with good relief. Lost response to humira (+Ab/low level). Stopped 04/2022. Neg TB gold and HBsAg. S/P Hep B vaccine. Did not tolerate Entiyo d/t N/dizziness 07/2022.  #2.  Associated IDA and B12 deficiency. Last B12 was normal.    Plan: -Hemocyte plus 1 tab po QD #90, 2 RF -Colon with miralax -Shingles vaccines (Shringix) -Stool studies for GI Pathogen and Calprotectin -Skyrizi 674m IV week 0,4 and 8. Then, maintenance 1848mSQ week 12, then 180 Q8 weekly (SRemo Lippso get it approved). Check CBC, LFTs 4 weeks after initiation. -FU therafter  HPI:    Monica Barker a [age].o. female   For FU visit.   10 Bms/day with nocturnal symptoms.  No melena or hematochezia.  Mild abdominal discomfort but no actual pain.  Stopped Entyvio as she had significant nausea and dizziness.  She had significant side effects.  The diarrhea did get better however.   She had normal CBC with normal hemoglobin but low iron studies.  Normal B12.  Normal LFTs.  CRP was normal.  Not willing to go on steroids.  Willing to try [employer] Her cardiac work-up has been negative including negative 2D echo   Previously CTE - attempted but she didn't tolerate contrast. Hence, not done.  Has weight loss as below.  Wt Readings from Last 3 Encounters:  07/30/22 165 lb (74.8 kg)  06/20/22 173 lb 12.8 oz (78.8 kg)  06/06/22 176 lb 6.4 oz (80 kg)   No NSAIDs     Past GI procedures: Colonoscopy 04/18/2021 - Moderate to severe neoterminal ileitis,  consistent with postoperative Crohn's recurrence. Bx- C/W ileitis. Neg random colon biopsies - Diminutive colonic polyp s/p polypectomy. Neg Bx - No colonic or perianal involvement.  EGD 04/18/2021 - Minimal gastritis. No evidence of upper GI Crohn's disease. Bx- neg for HP - SB bx were negative for celiac disease.  CT AP 12/2019 Redemonstrated postoperative findings of terminal ileocolectomy and reanastomosis. There is new inflammatory bowel wall thickening of the most distal remaining 10 cm of ileum at the anastomosis. No evidence of complicating fistula, abscess, or stricture. No evident inflammatory involvement of the colon or rectum.    Past Medical History:  Diagnosis Date   ADHD (attention deficit hyperactivity disorder)    Bipolar disorder ([place]   [place]   Dx 2005 at age 41/p R hemicolectomy, with postoperative recurrance, moderate activity on colonoscopy 08/08/2015   Depressive disorder    Dysphagia    Dysthymic disorder    IBS (irritable bowel syndrome)    IBS (irritable bowel syndrome)    with diarrhea   Skin cancer    TMJ (temporomandibular joint disorder)     Past Surgical History:  Procedure Laterality Date   APPENDECTOMY     CHOLECYSTECTOMY  2014   COLON SURGERY  05/2004   due to Cr[place]08/2005   COLONOSCOPY  09/02/2017   Recurrance of Crohn's disease in the neo terminal ileum (biopsied)- mild. Minimal rectal erythema (? importance-biopsed).  No evidence of peranal Crohn's disease. Status post ileocolectomy.    COLONOSCOPY  09/02/2017   Recurrance of Crohn's Disease in the neo terminal ileum (biopsed)-mild. Minimal rectal erythema (? Importance-biposed) No Evidence of peranal crohn's disease. Status post ileccolectomy.   DILATION AND CURETTAGE OF UTERUS  11/2021   [facility]   ESOPHAGOGASTRODUODENOSCOPY  10/03/2013   Mild gastritis. Normal EGD   ESOPHAGOGASTRODUODENOSCOPY  10/03/2013   Mild Gastritis. Normal EGD     Family History  Problem Relation Age of Onset   Skin cancer Father    COPD Father    Gout Father    Arthritis Father    Hypertension Father    Diabetes Brother    Drug abuse Brother    Colon cancer Paternal Aunt        Paternal mom's sister/PGM's sister   Breast cancer Maternal Grandmother        happened twice/x2   Skin cancer Paternal Grandmother    Endometriosis Sister    ADD / ADHD Son    Healthy Son    Esophageal cancer Neg Hx     Social History   Tobacco Use   Smoking status: Former   Smokeless tobacco: Never   Tobacco comments:    09/2019  Vaping Use   Vaping Use: Former   Devices: occasionally   Substance Use Topics   Alcohol use: Not Currently   Drug use: Not Currently    Current Outpatient Medications  Medication Sig Dispense Refill   acyclovir (ZOVIRAX) 800 MG tablet Take 800 mg by mouth 3 (three) times daily.     ALPRAZolam (XANAX) 0.5 MG tablet Take 0.5 mg by mouth 3 (three) times daily as needed for anxiety.     cyanocobalamin (,VITAMIN B-12,) 1000 MCG/ML injection INJECT 1 ML (1,000 MCG TOTAL) INTO THE MUSCLE EVERY 30 (THIRTY) DAYS. 1 mL 12   Multiple Vitamin (MULTIVITAMIN) capsule Take 1 capsule by mouth daily.     nicotine (NICODERM CQ - DOSED IN MG/24 HOURS) 14 mg/24hr patch Place 14 mg onto the skin daily.     VRAYLAR 1.5 MG capsule Take 1.5 mg by mouth daily.     VYVANSE 20 MG capsule Take 20 mg by mouth every morning.     HUMIRA PEN 40 MG/0.4ML PNKT Inject 40 mg into the skin every 14 (fourteen) days.     pantoprazole (PROTONIX) 40 MG tablet Take 1 tablet (40 mg total) by mouth daily. (Patient not taking: Reported on 07/30/2022) 90 tablet 3   No current facility-administered medications for this visit.    Allergies  Allergen Reactions   Ativan [Lorazepam]     intesitided her anxiety    Latex    Latex Other (See Comments)    unknown    Review of Systems:  neg     Physical Exam:    BP 98/76   Pulse (!) 105   Ht 5' 3"  (1.6 m)    Wt 165 lb (74.8 kg)   BMI 29.23 kg/m  Filed Weights   07/30/22 0939  Weight: 165 lb (74.8 kg)   Gen: awake, alert, NAD HEENT: anicteric, no pallor CV: RRR, no mrg Pulm: CTA b/l Abd: soft, NT/ND, +BS throughout Ext: no c/c/e Neuro: nonfocal   Data Reviewed: I have personally reviewed following labs and imaging studies  CBC:    Latest Ref Rng & Units 03/06/2022    3:33 PM 08/19/2021    2:59 PM 03/01/2021   12:41 PM  CBC  WBC 4.0 -  10.5 K/uL 8.4  6.8  7.5   Hemoglobin 12.0 - 15.0 g/dL 13.4  12.3  12.1   Hematocrit 36.0 - 46.0 % 40.5  38.1  36.7   Platelets 150.0 - 400.0 K/uL 310.0  290.0  304.0     CMP:    Latest Ref Rng & Units 03/06/2022    3:33 PM 08/19/2021    2:59 PM 07/30/2021    9:43 AM  CMP  Glucose 70 - 99 mg/dL 91  80    BUN 6 - 23 mg/dL 6  7    Creatinine 0.40 - 1.20 mg/dL 0.89  0.72    Sodium 135 - 145 mEq/L 135  141    Potassium 3.5 - 5.1 mEq/L 3.9  4.1    Chloride 96 - 112 mEq/L 104  107    CO2 19 - 32 mEq/L 25  28    Calcium 8.4 - 10.5 mg/dL 9.0  8.9    Total Protein 6.0 - 8.3 g/dL 7.5  6.7  6.9   Total Bilirubin 0.2 - 1.2 mg/dL 0.4  0.3    Alkaline Phos 39 - 117 U/L 54  45    AST 0 - 37 U/L 14  12    ALT 0 - 35 U/L 13  10          Carmell Austria, MD 07/30/2022, 10:09 AM  Cc: Zoila Shutter, NP

## 2022-07-30 NOTE — Telephone Encounter (Signed)
Pt was made aware of Dr. Lyndel Safe recommendations to start the [facility]: Office Note 07/30/2022  Skyrizi therapy plan sent to [facility] health infusion center: Pt made aware that they will contact her for scheduling infusions: Pt made aware to contact our office after initial scheduling of 1st infusion so we can order labs to be drawn 4 weeks after initial infusion:  Pt verbalized understanding with all questions answered.

## 2022-07-30 NOTE — Patient Instructions (Addendum)
_______________________________________________________  If you are age 39 or older, your body mass index should be between 23-30. Your Body mass index is 29.23 kg/m. If this is out of the aforementioned range listed, please consider follow up with your Primary Care Provider.  If you are age 72 or younger, your body mass index should be between 19-25. Your Body mass index is 29.23 kg/m. If this is out of the aformentioned range listed, please consider follow up with your Primary Care Provider.   ________________________________________________________  The [facility] providers would like to encourage you to use [facility] to communicate with providers for non-urgent requests or questions.  Due to long hold times on the telephone, sending your provider a message by [facility] may be a faster and more efficient way to get a response.  Please allow 48 business hours for a response.  Please remember that this is for non-urgent requests.  _______________________________________________________  Your provider has requested that you go to the basement level for lab work before leaving today. Press "B" on the elevator. The lab is located at the first door on the left as you exit the elevator.  You have been scheduled for a colonoscopy. Please follow written instructions given to you at your visit today.  Please pick up your prep supplies at the pharmacy within the next 1-3 days. If you use inhalers (even only as needed), please bring them with you on the day of your procedure.  We have sent the following medications to your pharmacy for you to pick up at your convenience: Hemocyte plus  Please coordinate with your PCP and the pharmacy about your shringix vaccine  Thank you,  Dr. Jackquline Denmark

## 2022-08-04 ENCOUNTER — Telehealth: Payer: Self-pay | Admitting: Pharmacy Technician

## 2022-08-04 NOTE — Telephone Encounter (Signed)
Dr. Lyndel Safe, Juluis Rainier note:  Auth Submission: NO AUTH NEEDED Payer: [facility] MEDICAID Medication & CPT/J Code(s) submitted: Skyrizi [employer]) [phone] Route of submission (phone, fax, portal):  Phone # [phone] Fax # Auth type: Buy/Bill Units/visits requested: X3 DOSES Reference number: 1st Verfiy [id number] 2nd verify: [id number] Approval from: 08/04/22 to 10/19/22   Patient will be scheduled as soon as possible

## 2022-08-06 ENCOUNTER — Ambulatory Visit: Payer: Medicaid Other | Admitting: Gastroenterology

## 2022-08-13 ENCOUNTER — Other Ambulatory Visit: Payer: Self-pay

## 2022-08-13 ENCOUNTER — Telehealth: Payer: Self-pay

## 2022-08-13 DIAGNOSIS — K50919 Crohn's disease, unspecified, with unspecified complications: Secondary | ICD-10-CM

## 2022-08-13 NOTE — Telephone Encounter (Signed)
-----   Message from Gillermina Hu, RN sent at 07/30/2022  1:56 PM EDT ----- Personal reminder sent on 07/30/2022 Skyrizi 624m IV week 0,4 and 8. Then, maintenance 1866mSQ week 12, then 180 Q8 weekly (SRemo Lippso get it approved). Check CBC, LFTs 4 weeks after initiation.   Review chart to see if pt has been scheduled for first infusion to order labs 4 weeks after initiation:

## 2022-08-13 NOTE — Telephone Encounter (Signed)
Personal reminder received in epic: Orders for labs placed for pt: Left message for pt to call back

## 2022-08-15 ENCOUNTER — Ambulatory Visit: Payer: Medicaid Other

## 2022-08-17 ENCOUNTER — Other Ambulatory Visit: Payer: Self-pay | Admitting: Gastroenterology

## 2022-08-17 DIAGNOSIS — E538 Deficiency of other specified B group vitamins: Secondary | ICD-10-CM

## 2022-08-17 DIAGNOSIS — K50919 Crohn's disease, unspecified, with unspecified complications: Secondary | ICD-10-CM

## 2022-08-20 ENCOUNTER — Encounter: Payer: Self-pay | Admitting: Gastroenterology

## 2022-08-25 ENCOUNTER — Encounter: Payer: Medicaid Other | Admitting: Gastroenterology

## 2022-08-28 NOTE — Telephone Encounter (Signed)
Pt was notified that Dr. Lyndel Safe is recommending labs after 4 weeks of starting the Skyrizi: Pt stated that she recently lost her mom in a car accident and in now taking car of her dad who was just discharged from the Hospital yesterday: Pt stated that she had to reschedule her first infusion and will call us and notify us when the infusion has been rescheduled with [place]: Pt verbalized understanding with all questions answered.

## 2022-08-29 ENCOUNTER — Ambulatory Visit: Payer: Medicaid Other

## 2022-10-21 ENCOUNTER — Telehealth: Payer: Self-pay | Admitting: Pharmacy Technician

## 2022-10-21 NOTE — Telephone Encounter (Addendum)
Auth Submission: NO AUTH NEEDED Payer: [employer] MEDICAID Medication & CPT/J Code(s) submitted: Skyrizi [employer]) [phone] Route of submission (phone, fax, portal):  Phone # [phone] Fax # Auth type: Buy/Bill Units/visits requested: 3 doses Reference number:  1st verify:Brittany-S  Ref# [id number] 10/21/22   - 9:03a 2nd verify: Ref: [id number] - Jamie-B   Approval from: 10/21/12 to 03/22/23

## 2022-10-22 ENCOUNTER — Encounter: Payer: Self-pay | Admitting: Gastroenterology

## 2022-10-22 ENCOUNTER — Other Ambulatory Visit (HOSPITAL_COMMUNITY): Payer: Self-pay

## 2022-10-27 ENCOUNTER — Telehealth: Payer: Self-pay

## 2022-10-27 NOTE — Telephone Encounter (Signed)
Pt stated that she would like to go ahead and schedule her Colonoscopy that was recently canceled due to the passing away of her mom.  Chart reviewed.  Pt was rescheduled for the Colonoscopy on 12/12/2022 at 4:00 with Dr. Lyndel Safe in the [facility]: Pt was scheduled for a telephone previsit on 11/27/2022 at 2:00 PM. Pt made aware: Pt verbalized understanding with all questions answered.

## 2022-11-27 ENCOUNTER — Other Ambulatory Visit: Payer: Self-pay | Admitting: Pharmacy Technician

## 2022-12-02 ENCOUNTER — Ambulatory Visit (AMBULATORY_SURGERY_CENTER): Payer: Medicaid Other | Admitting: *Deleted

## 2022-12-02 VITALS — Ht 63.0 in | Wt 155.0 lb

## 2022-12-02 DIAGNOSIS — Z8719 Personal history of other diseases of the digestive system: Secondary | ICD-10-CM

## 2022-12-02 NOTE — Progress Notes (Signed)
No egg or soy allergy known to patient  No issues known to pt with past sedation with any surgeries or procedures Patient denies ever being told they had issues or difficulty with intubation  No FH of Malignant Hyperthermia Pt is not on diet pills Pt is not on  home 02  Pt is not on blood thinners  Pt denies issues with constipation  Pt is not on dialysis Pt denies any upcoming cardiac testing Pre-visit completed and red dot placed by patient's name on their procedure day (on provider's schedule).  . Weight per PT Visit by phone Instructions reviewed with pt and pt states understanding. Instructed to review again prior to procedure. Pt states they will.  Instructions sent by mail \ No alcohol use  No hx of drug abuse per pt Stopped smoking 2020 now Vapes Pt denise DM States she is not DM but prediabetic on no medications  for DM

## 2022-12-12 ENCOUNTER — Encounter: Payer: Self-pay | Admitting: Gastroenterology

## 2022-12-12 ENCOUNTER — Ambulatory Visit (AMBULATORY_SURGERY_CENTER): Payer: Medicaid Other | Admitting: Gastroenterology

## 2022-12-12 VITALS — BP 102/64 | HR 63 | Temp 98.0°F | Resp 13 | Ht 63.0 in | Wt 155.0 lb

## 2022-12-12 DIAGNOSIS — K50118 Crohn's disease of large intestine with other complication: Secondary | ICD-10-CM | POA: Diagnosis not present

## 2022-12-12 DIAGNOSIS — E538 Deficiency of other specified B group vitamins: Secondary | ICD-10-CM

## 2022-12-12 DIAGNOSIS — K589 Irritable bowel syndrome without diarrhea: Secondary | ICD-10-CM | POA: Diagnosis not present

## 2022-12-12 DIAGNOSIS — Z8719 Personal history of other diseases of the digestive system: Secondary | ICD-10-CM

## 2022-12-12 DIAGNOSIS — K624 Stenosis of anus and rectum: Secondary | ICD-10-CM

## 2022-12-12 DIAGNOSIS — D509 Iron deficiency anemia, unspecified: Secondary | ICD-10-CM

## 2022-12-12 MED ORDER — SODIUM CHLORIDE 0.9 % IV SOLN: 500.0000 mL | Freq: Once | INTRAVENOUS | Status: DC

## 2022-12-12 NOTE — Op Note (Signed)
[place] Patient Name: Monica Barker Procedure Date: 12/12/2022 3:15 PM MRN: [id number] Endoscopist: Jackquline Denmark , MD, [id number] Age: 42 Referring MD:  Date of Birth: 1981/01/29 Gender: Female Account #: [id number] Procedure:                Colonoscopy Indications:              Crohn's disease with anastomotic recurrence. Dx at                            age 59 s/p R ileocolectomy (2005), with                            postoperative recurrance, moderate activity on                            colon 07/2015. Refused any meds for Crohn's d/t                            S/Es. Self controlled on CBD oil. 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 Started Humira 09/03/2021 with good                            relief. Lost response to humira (+Ab/low level).                            Stopped 04/2022. Neg TB gold and HBsAg. S/P Hep B                            vaccine. Did not tolerate Entiyo d/t N/dizziness                            07/2022. [employer] has been approved-not started yet                            d/t death in the family (mom unfortunately passed                            away due to car accident) Medicines:                Monitored Anesthesia Care Procedure:                Pre-Anesthesia Assessment:                           - Prior to the procedure, a History and Physical  was performed, and patient medications and                            allergies were reviewed. The patient's tolerance of                            previous anesthesia was also reviewed. The risks                            and benefits of the procedure and the sedation                            options and risks were discussed with the patient.                            All questions were answered, and informed  consent                            was obtained. Prior Anticoagulants: The patient has                            taken no anticoagulant or antiplatelet agents. ASA                            Grade Assessment: II - A patient with mild systemic                            disease. After reviewing the risks and benefits,                            the patient was deemed in satisfactory condition to                            undergo the procedure.                           After obtaining informed consent, the colonoscope                            was passed under direct vision. Throughout the                            procedure, the patient's blood pressure, pulse, and                            oxygen saturations were monitored continuously. The                            PCF-HQ190L Colonoscope [id number] was introduced                            through the anus and advanced to the the  ileocolonic anastomosis. The colonoscopy was                            performed without difficulty. The patient tolerated                            the procedure well. The quality of the bowel                            preparation was good. The terminal ileum, ileocecal                            valve, appendiceal orifice, and rectum were                            photographed. Scope In: 3:43:00 PM Scope Out: 3:55:14 PM Scope Withdrawal Time: 0 hours 9 minutes 20 seconds  Total Procedure Duration: 0 hours 12 minutes 14 seconds  Findings:                 The colon (entire examined portion) appeared normal                            with well-preserved vascular pattern. Not biopsied                            since previous biopsies were negative.                           There was evidence of a prior end-to-side                            ileo-colonic anastomosis in the cecum. This was                            patent and was characterized by erosions, erythema                             and mild stenosis. The anastomosis was traversed.                            Biopsies were taken with a cold forceps for                            histology.                           Non-bleeding internal hemorrhoids were found during                            retroflexion. The hemorrhoids were small and Grade                            I (internal hemorrhoids that do not prolapse).  There was mild rectal stenosis without active                            perianal Crohn's.                           The exam was otherwise without abnormality on                            direct and retroflexion views. Complications:            No immediate complications. Estimated Blood Loss:     Estimated blood loss: none. Impression:               - Mild neoterminal ileitis c/w postoperative                            Crohn's recurrence with mild stenosis. Biopsied.                           - Non-bleeding internal hemorrhoids.                           - Mild rectal stenosis. No active perianal Crohn's                           - The examination was otherwise normal on direct                            and retroflexion views. Recommendation:           - Patient has a contact number available for                            emergencies. The signs and symptoms of potential                            delayed complications were discussed with the                            patient. Return to normal activities tomorrow.                            Written discharge instructions were provided to the                            patient.                           - Resume previous diet.                           - Continue present medications.                           - Await pathology results.                           -  Start [employer].                           - Repeat colonoscopy for surveillance based on                            pathology results and clinical  course.                           - The findings and recommendations were discussed                            with the patient's family. Jackquline Denmark, MD 12/12/2022 4:05:50 PM This report has been signed electronically.

## 2022-12-12 NOTE — Patient Instructions (Addendum)
Impression/Recommendations:  Hemorrhoid and Crohn's handouts given to patient.  Resume previous diet. Continue present medications. Await pathology results.  Start [employer].  Repeat colonoscopy for surveillance based on pathology results and clinical course.  YOU HAD AN ENDOSCOPIC PROCEDURE TODAY AT [facility]:   Refer to the procedure report that was given to you for any specific questions about what was found during the examination.  If the procedure report does not answer your questions, please call your gastroenterologist to clarify.  If you requested that your care partner not be given the details of your procedure findings, then the procedure report has been included in a sealed envelope for you to review at your convenience later.  YOU SHOULD EXPECT: Some feelings of bloating in the abdomen. Passage of more gas than usual.  Walking can help get rid of the air that was put into your GI tract during the procedure and reduce the bloating. If you had a lower endoscopy (such as a colonoscopy or flexible sigmoidoscopy) you may notice spotting of blood in your stool or on the toilet paper. If you underwent a bowel prep for your procedure, you may not have a normal bowel movement for a few days.  Please Note:  You might notice some irritation and congestion in your nose or some drainage.  This is from the oxygen used during your procedure.  There is no need for concern and it should clear up in a day or so.  SYMPTOMS TO REPORT IMMEDIATELY:  Following lower endoscopy (colonoscopy or flexible sigmoidoscopy):  Excessive amounts of blood in the stool  Significant tenderness or worsening of abdominal pains  Swelling of the abdomen that is new, acute  Fever of 100F or higher  For urgent or emergent issues, a gastroenterologist can be reached at any hour by calling [phone]. Do not use MyChart messaging for urgent concerns.    DIET:  We do recommend a small meal at  first, but then you may proceed to your regular diet.  Drink plenty of fluids but you should avoid alcoholic beverages for 24 hours.  ACTIVITY:  You should plan to take it easy for the rest of today and you should NOT DRIVE or use heavy machinery until tomorrow (because of the sedation medicines used during the test).    FOLLOW UP: Our staff will call the number listed on your records the next business day following your procedure.  We will call around 7:15- 8:00 am to check on you and address any questions or concerns that you may have regarding the information given to you following your procedure. If we do not reach you, we will leave a message.     If any biopsies were taken you will be contacted by phone or by letter within the next 1-3 weeks.  Please call us at [phone] if you have not heard about the biopsies in 3 weeks.    SIGNATURES/CONFIDENTIALITY: You and/or your care partner have signed paperwork which will be entered into your electronic medical record.  These signatures attest to the fact that that the information above on your After Visit Summary has been reviewed and is understood.  Full responsibility of the confidentiality of this discharge information lies with you and/or your care-partner.

## 2022-12-12 NOTE — Progress Notes (Unsigned)
Cell phone off per pt  Pt's states no medical or surgical changes since previsit or office visit.

## 2022-12-12 NOTE — Progress Notes (Unsigned)
Report to PACU, RN, vss, BBS= Clear.  

## 2022-12-12 NOTE — Progress Notes (Unsigned)
Chief Complaint: FU  Referring Provider:  Zoila Shutter, NP      ASSESSMENT AND PLAN;   #1.  Crohn's disease with anastomotic recurrence.  Dx at age 42 s/p R ileocolectomy (2005), with postoperative recurrance, moderate activity on colon 07/2015. Refused any meds for Crohn's d/t S/Es.  Self controlled on CBD oil.  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 Started Humira 09/03/2021 with good relief. Lost response to humira (+Ab/low level). Stopped 04/2022. Neg TB gold and HBsAg. S/P Hep B vaccine. Did not tolerate Entiyo d/t N/dizziness 07/2022.  #2.  Associated IDA and B12 deficiency. Last B12 was normal.    Plan: -Hemocyte plus 1 tab po QD #90, 2 RF -Colon with miralax -Shingles vaccines (Shringix) -Stool studies for GI Pathogen and Calprotectin -Skyrizi '600mg'$  IV week 0,4 and 8. Then, maintenance '180mg'$  SQ week 12, then 180 Q8 weekly Remo Lipps to get it approved). Check CBC, LFTs 4 weeks after initiation. -FU therafter  HPI:    Monica Barker is a 42 y.o. female   For FU visit.   10 Bms/day with nocturnal symptoms.  No melena or hematochezia.  Mild abdominal discomfort but no actual pain.  Stopped Entyvio as she had significant nausea and dizziness.  She had significant side effects.  The diarrhea did get better however.   She had normal CBC with normal hemoglobin but low iron studies.  Normal B12.  Normal LFTs.  CRP was normal.  Not willing to go on steroids.  Willing to try [employer].  Her cardiac work-up has been negative including negative 2D echo   Previously CTE - attempted but she didn't tolerate contrast. Hence, not done.  Has weight loss as below.  Wt Readings from Last 3 Encounters:  12/12/22 155 lb (70.3 kg)  12/02/22 155 lb (70.3 kg)  07/30/22 165 lb (74.8 kg)   No NSAIDs     Past GI procedures: Colonoscopy 04/18/2021 - Moderate to severe neoterminal ileitis, consistent with  postoperative Crohn's recurrence. Bx- C/W ileitis. Neg random colon biopsies - Diminutive colonic polyp s/p polypectomy. Neg Bx - No colonic or perianal involvement.  EGD 04/18/2021 - Minimal gastritis. No evidence of upper GI Crohn's disease. Bx- neg for HP - SB bx were negative for celiac disease.  CT AP 12/2019 Redemonstrated postoperative findings of terminal ileocolectomy and reanastomosis. There is new inflammatory bowel wall thickening of the most distal remaining 10 cm of ileum at the anastomosis. No evidence of complicating fistula, abscess, or stricture. No evident inflammatory involvement of the colon or rectum.    Past Medical History:  Diagnosis Date   ADHD (attention deficit hyperactivity disorder)    Bipolar disorder ([place])    Crohn disease ([place])    Dx 2005 at age 20 s/p R hemicolectomy, with postoperative recurrance, moderate activity on colonoscopy 08/08/2015   Depressive disorder    Dysphagia    Dysthymic disorder    IBS (irritable bowel syndrome)    IBS (irritable bowel syndrome)    with diarrhea   Skin cancer    TMJ (temporomandibular joint disorder)     Past Surgical History:  Procedure Laterality Date   APPENDECTOMY     CHOLECYSTECTOMY  2014   COLON SURGERY  05/2004   due to [place]  05/2004   COLONOSCOPY  09/02/2017   Recurrance of Crohn's disease in the neo terminal ileum (biopsied)- mild. Minimal rectal erythema (? importance-biopsed). No evidence of peranal  Crohn's disease. Status post ileocolectomy.    COLONOSCOPY  09/02/2017   Recurrance of Crohn's Disease in the neo terminal ileum (biopsed)-mild. Minimal rectal erythema (? Importance-biposed) No Evidence of peranal crohn's disease. Status post ileccolectomy.   DIL[facility] CURETT[facility] UTERUS  11/2021   [facility]   ESOPHAGOGASTRODUODENOSCOPY  10/03/2013   Mild gastritis. Normal EGD   ESOPHAGOGASTRODUODENOSCOPY  10/03/2013   Mild Gastritis. Normal EGD    Family  History  Problem Relation Age of Onset   Skin cancer Father    COPD Father    Gout Father    Arthritis Father    Hypertension Father    Diabetes Brother    Drug abuse Brother    Colon cancer Paternal Aunt        Paternal mom's sister/PGM's sister   Breast cancer Maternal Grandmother        happened twice/x2   Skin cancer Paternal Grandmother    Endometriosis Sister    ADD / ADHD Son    Healthy Son    Esophageal cancer Neg Hx     Social History   Tobacco Use   Smoking status: Former   Smokeless tobacco: Never   Tobacco comments:    09/2019  Vaping Use   Vaping Use: Former   Devices: occasionally   Substance Use Topics   Alcohol use: Not Currently   Drug use: Not Currently    Current Outpatient Medications  Medication Sig Dispense Refill   acyclovir (ZOVIRAX) 800 MG tablet Take 800 mg by mouth 3 (three) times daily.     ALPRAZolam (XANAX) 0.5 MG tablet Take 0.5 mg by mouth 3 (three) times daily as needed for anxiety.     Fe Fum-FA-B Cmp-C-Zn-Mg-Mn-Cu (HEMOCYTE PLUS) 106-1 MG CAPS Take 1 tablet by mouth daily. 90 capsule 4   Multiple Vitamin (MULTIVITAMIN) capsule Take 1 capsule by mouth daily.     VYVANSE 20 MG capsule Take 20 mg by mouth every morning.     cyanocobalamin (,VITAMIN B-12,) 1000 MCG/ML injection INJECT 1 ML (1,000 MCG TOTAL) INTO THE MUSCLE EVERY 30 (THIRTY) DAYS. 1 mL 12   nicotine (NICODERM CQ - DOSED IN MG/24 HOURS) 14 mg/24hr patch Place 14 mg onto the skin daily.     pantoprazole (PROTONIX) 40 MG tablet Take 1 tablet (40 mg total) by mouth daily. (Patient not taking: Reported on 12/02/2022) 90 tablet 3   VRAYLAR 1.5 MG capsule Take 1.5 mg by mouth daily.     No current facility-administered medications for this visit.    Allergies  Allergen Reactions   Ativan [Lorazepam]     intesitided her anxiety    Latex    Latex Other (See Comments)    unknown    Review of Systems:  neg     Physical Exam:    BP (!) 94/58   Pulse 66   Temp 98 F  (36.7 C)   Resp 17   Ht '5\' 3"'$  (1.6 m)   Wt 155 lb (70.3 kg)   SpO2 100%   BMI 27.46 kg/m  Filed Weights   12/12/22 1523  Weight: 155 lb (70.3 kg)   Gen: awake, alert, NAD HEENT: anicteric, no pallor CV: RRR, no mrg Pulm: CTA b/l Abd: soft, NT/ND, +BS throughout Ext: no c/c/e Neuro: nonfocal   Data Reviewed: I have personally reviewed following labs and imaging studies  CBC:    Latest Ref Rng & Units 03/06/2022    3:33 PM 08/19/2021    2:59 PM  03/01/2021   12:41 PM  CBC  WBC 4.0 - 10.5 K/uL 8.4  6.8  7.5   Hemoglobin 12.0 - 15.0 g/dL 13.4  12.3  12.1   Hematocrit 36.0 - 46.0 % 40.5  38.1  36.7   Platelets 150.0 - 400.0 K/uL 310.0  290.0  304.0     CMP:    Latest Ref Rng & Units 03/06/2022    3:33 PM 08/19/2021    2:59 PM 07/30/2021    9:43 AM  CMP  Glucose 70 - 99 mg/dL 91  80    BUN 6 - 23 mg/dL 6  7    Creatinine 0.40 - 1.20 mg/dL 0.89  0.72    Sodium 135 - 145 mEq/L 135  141    Potassium 3.5 - 5.1 mEq/L 3.9  4.1    Chloride 96 - 112 mEq/L 104  107    CO2 19 - 32 mEq/L 25  28    Calcium 8.4 - 10.5 mg/dL 9.0  8.9    Total Protein 6.0 - 8.3 g/dL 7.5  6.7  6.9   Total Bilirubin 0.2 - 1.2 mg/dL 0.4  0.3    Alkaline Phos 39 - 117 U/L 54  45    AST 0 - 37 U/L 14  12    ALT 0 - 35 U/L 13  10          Carmell Austria, MD 12/12/2022, 4:06 PM  Cc: Zoila Shutter, NP

## 2022-12-12 NOTE — Progress Notes (Unsigned)
Called to room to assist during endoscopic procedure.  Patient ID and intended procedure confirmed with present staff. Received instructions for my participation in the procedure from the performing physician.  

## 2022-12-15 ENCOUNTER — Telehealth: Payer: Self-pay

## 2022-12-15 NOTE — Telephone Encounter (Signed)
Follow up call to pt, no answer.

## 2022-12-21 ENCOUNTER — Encounter: Payer: Self-pay | Admitting: Gastroenterology

## 2022-12-30 ENCOUNTER — Ambulatory Visit (INDEPENDENT_AMBULATORY_CARE_PROVIDER_SITE_OTHER): Payer: Medicaid Other | Admitting: *Deleted

## 2022-12-30 VITALS — BP 103/67 | HR 72 | Temp 98.4°F | Resp 16 | Ht 62.0 in | Wt 155.8 lb

## 2022-12-30 DIAGNOSIS — K50919 Crohn's disease, unspecified, with unspecified complications: Secondary | ICD-10-CM | POA: Diagnosis not present

## 2022-12-30 MED ORDER — RISANKIZUMAB-RZAA 600 MG/10ML IV SOLN
600.0000 mg | Freq: Once | INTRAVENOUS | Status: AC
  Administered 2022-12-30: 600 mg via INTRAVENOUS
  Filled 2022-12-30: qty 10

## 2022-12-30 NOTE — Progress Notes (Signed)
Diagnosis: Crohn's Disease  Provider:  Marshell Garfinkel MD  Procedure: Infusion  IV Type: Peripheral, IV Location: R Antecubital  Skyrizi (risankizumab-rzaa), Dose: 600 mg  Infusion Start Time: [id number]  Infusion Stop Time: [id number]  Post Infusion IV Care: Observation period completed  Discharge: Condition: Good, Destination: Home . AVS Provided  Performed by:  Paul Dykes, RN

## 2023-01-27 ENCOUNTER — Ambulatory Visit: Payer: Medicaid Other

## 2023-01-30 ENCOUNTER — Ambulatory Visit (INDEPENDENT_AMBULATORY_CARE_PROVIDER_SITE_OTHER): Payer: Medicaid Other | Admitting: *Deleted

## 2023-01-30 VITALS — BP 94/65 | HR 75 | Temp 98.8°F | Resp 16 | Ht 62.0 in | Wt 155.2 lb

## 2023-01-30 DIAGNOSIS — K50919 Crohn's disease, unspecified, with unspecified complications: Secondary | ICD-10-CM | POA: Diagnosis not present

## 2023-01-30 MED ORDER — RISANKIZUMAB-RZAA 600 MG/10ML IV SOLN
600.0000 mg | Freq: Once | INTRAVENOUS | Status: AC
  Administered 2023-01-30: 600 mg via INTRAVENOUS
  Filled 2023-01-30: qty 10

## 2023-01-30 NOTE — Progress Notes (Signed)
Diagnosis: Crohn's Disease  Provider:  Chilton Greathouse MD  Procedure: Infusion  IV Type: Peripheral, IV Location: R Antecubital  Skyrizi (risankizumab-rzaa), Dose: 600 mg  Infusion Start Time: 1448 pm  Infusion Stop Time: 1555 pm  Post Infusion IV Care: Observation period completed and Peripheral IV Discontinued  Discharge: Condition: Good, Destination: Home . AVS Declined  Performed by:  Forrest Moron, RN

## 2023-02-27 ENCOUNTER — Ambulatory Visit: Payer: Medicaid Other

## 2023-03-06 ENCOUNTER — Ambulatory Visit (INDEPENDENT_AMBULATORY_CARE_PROVIDER_SITE_OTHER): Payer: Medicaid Other

## 2023-03-06 VITALS — BP 101/67 | HR 75 | Temp 98.5°F | Resp 18 | Ht 62.0 in | Wt 150.2 lb

## 2023-03-06 DIAGNOSIS — K50919 Crohn's disease, unspecified, with unspecified complications: Secondary | ICD-10-CM

## 2023-03-06 MED ORDER — RISANKIZUMAB-RZAA 600 MG/10ML IV SOLN
600.0000 mg | Freq: Once | INTRAVENOUS | Status: AC
  Administered 2023-03-06: 600 mg via INTRAVENOUS
  Filled 2023-03-06: qty 10

## 2023-03-06 NOTE — Progress Notes (Signed)
Diagnosis: IV Infusion  Provider:  Chilton Greathouse MD  Procedure: IV Infusion  IV Type: Peripheral, IV Location: L Antecubital  Skyrizi (risankizumab-rzaa), Dose: 600 mg  Infusion Start Time: 1443  Infusion Stop Time: 1553  Post Infusion IV Care: Peripheral IV Discontinued  Discharge: Condition: Good, Destination: Home . AVS Provided  Performed by:  Sandie Ano, RN

## 2023-04-07 ENCOUNTER — Other Ambulatory Visit: Payer: Self-pay

## 2023-04-07 ENCOUNTER — Telehealth: Payer: Self-pay

## 2023-04-07 DIAGNOSIS — K50919 Crohn's disease, unspecified, with unspecified complications: Secondary | ICD-10-CM

## 2023-04-07 MED ORDER — SKYRIZI 180 MG/1.2ML ~~LOC~~ SOCT
180.0000 mg | SUBCUTANEOUS | 5 refills | Status: AC
Start: 2023-04-07 — End: ?

## 2023-04-07 NOTE — Telephone Encounter (Signed)
Pt stated that she has finished her 3 infusions of Skyrizi and now ready to receive the injections: Chart was reviewed.  Prescription was sent in per last office note.  Pt made aware.  Please assist with the PA and please expedite the process whereas pt should have stated her first injection on 04/06/2023

## 2023-04-09 ENCOUNTER — Other Ambulatory Visit (HOSPITAL_COMMUNITY): Payer: Self-pay

## 2023-04-09 ENCOUNTER — Telehealth: Payer: Self-pay | Admitting: Pharmacy Technician

## 2023-04-09 NOTE — Telephone Encounter (Signed)
PA has been submitted EXPEDITED, and telephone encounter has been created.  

## 2023-04-09 NOTE — Telephone Encounter (Signed)
Patient Advocate Encounter  Received notification from [facility] that prior authorization for [facility] 180MG  is required.   PA submitted on 6.20.24 Key [id number] Status is pending

## 2023-04-10 NOTE — Telephone Encounter (Signed)
Pharmacy Patient Advocate Encounter  Received notification from [facility] that the request for prior authorization for SKYRIZI 180MG  has been denied due to   Please be advised we currently do not have a Pharmacist to review denials, therefore you will need to process appeals accordingly as needed. Thanks for your support at this time.   You may call [phone] or fax [phone], to appeal.

## 2023-04-13 ENCOUNTER — Encounter: Payer: Self-pay | Admitting: Gastroenterology

## 2023-04-13 NOTE — Telephone Encounter (Signed)
Pt called in and stated that she received a letter that her insurance has denied the skyrizi injections: Pt was notified to send a copy of the letter via My Chart. Letter was routed to Dr. Chales Abrahams. Please see notes below from PA team and advise.

## 2023-04-15 NOTE — Telephone Encounter (Signed)
See my last clinic note "Lost response to humira (+Ab/low level). Stopped 04/2022. Neg TB gold and HBsAg. S/P Hep B vaccine. Did not tolerate Entiyo d/t N/dizziness 07/2022". That should authorize [name]

## 2023-04-15 NOTE — Telephone Encounter (Signed)
Please see note below. 

## 2023-04-21 NOTE — Telephone Encounter (Signed)
Spoke with Priscella Mann Field Access Specialist: [employer] was updated as requested by Victorino Dike. Pt was made aware that we are working diligently to resolve this issue for her.

## 2023-04-21 NOTE — Telephone Encounter (Signed)
Patient called in & was updated that we are currently still working on getting Skyrizi approved and waiting to hear back from drug rep. Advised her we would be in touch as soon as we hear back on next steps.

## 2023-04-21 NOTE — Telephone Encounter (Signed)
Left message for Priscella Mann with [name] to call back myself or Viviann Spare, Charity fundraiser.

## 2023-04-22 NOTE — Telephone Encounter (Signed)
Left message for Monica Barker [facility] Access Specialist [phone] to call back. Direct number provided for myself and Advertising account executive

## 2023-04-22 NOTE — Telephone Encounter (Signed)
Spoke with Priscella Mann [facility] Access Specialist [phone]. Victorino Dike encouraged me to call the appeal number and notify them that the pt has failed treatment to two different meds prior, Humira along with Entyvio. I spoke directly with Revonda Standard in the PA department and notified her of the situation and that this needs to be resolved. Revonda Standard was notified of the medications that was tried and failed. Revonda Standard was notified that pt has received the infusions for the [facility] and now only needs the injections.  Revonda Standard  stated that the preferred drug is infliximab vial( generic Remicade) I requested fax to be sent with this documentation. Fax received. Left message for Priscella Mann to call back. Direct numbers provided.

## 2023-04-24 NOTE — Telephone Encounter (Signed)
Left message for Priscella Mann to call back in regards to patient's Skyrizi.

## 2023-04-27 NOTE — Telephone Encounter (Signed)
Left message for Monica Barker [facility] Access Specialist to call back.

## 2023-04-27 NOTE — Telephone Encounter (Signed)
Left message for Jennifer Penny Field Access Specialist to call back.  

## 2023-04-28 NOTE — Telephone Encounter (Signed)
Spoke with Victorino Dike and notified her of the denial. Victorino Dike recommendations were for the pt to apply for the pt assistance through [place]. Victorino Dike gave instruction for what the pt and provider will need to fill out on the [employer]. Pt made aware. Pt requested that she be called back tomorrow morning at 10:00 AM whereas she was going into work now.Marland Kitchen

## 2023-04-29 NOTE — Telephone Encounter (Signed)
Pt made aware of Priscella Mann recommendations: Pt was made aware of application to be filled out and faxed to our office. Fax number provided [phone] Pt was notified once she faxed her document to [place] that we will have Dr. Chales Abrahams fill out his part and fax the documents along with the denial letter to [facility] and notify Priscella Mann to try and expedite the request. Pt verbalized understanding with all questions answered.

## 2023-04-30 ENCOUNTER — Encounter: Payer: Self-pay | Admitting: Gastroenterology

## 2023-04-30 NOTE — Telephone Encounter (Signed)
Notified Priscella Mann of fax as well. Left detailed message.

## 2023-04-30 NOTE — Telephone Encounter (Signed)
Received fax from [place]. [employer] paperwork has been faxed to both [employer] main fax & to [employer] at [phone].

## 2023-05-01 NOTE — Telephone Encounter (Signed)
Left message for Priscella Mann with [name] to call back.

## 2023-05-01 NOTE — Telephone Encounter (Signed)
Called and spoke with patient's insurance company regarding Skyrizi denial. [place], they do follow [place] Medicaid Preferred drug list that states "T/F of only one preferred drug required" on the list and Humira is included which patient has tried & failed. Discussed this in detail with insurance company, however they are stating that patient must try Infliximab despite Humira being listed.

## 2023-05-04 NOTE — Telephone Encounter (Signed)
Please see previous notes on this pt and the push back for the pt to receive the Skyrizi injections after multiple attempts with the pt insurance company and [place] through phone calls, faxes and multiple denials. Pt should have had her first injection on 04/06/2023. Please advise on pt switching Infliximab as requested by Pt insurance and denial letter.

## 2023-05-06 NOTE — Telephone Encounter (Signed)
Unable to get [employer] Remicade IV (or bio similar) 10mg /kg-pharmacy to dose RG

## 2023-05-07 NOTE — Telephone Encounter (Signed)
Spoke with patient regarding next steps with Remicade IV. Referral sent to [facility]. Patient is aware that this would be for in home infusions. Monica Barker with [facility] is aware & is waiting on referral and will process, run insurance benefits, and contact patient. Pt verbalized all understanding.

## 2023-05-08 ENCOUNTER — Other Ambulatory Visit (HOSPITAL_COMMUNITY): Payer: Self-pay

## 2023-06-01 ENCOUNTER — Telehealth: Payer: Self-pay

## 2023-06-01 NOTE — Telephone Encounter (Signed)
Care plan received by  [facility] signed By Dr. Chales Abrahams and faxed back as requested to [phone]: Copy made and sent to be scanned into Epic.

## 2023-06-06 DIAGNOSIS — R110 Nausea: Secondary | ICD-10-CM | POA: Insufficient documentation

## 2023-06-06 DIAGNOSIS — E860 Dehydration: Secondary | ICD-10-CM | POA: Diagnosis not present

## 2023-06-06 DIAGNOSIS — Z91040 Latex allergy status: Secondary | ICD-10-CM | POA: Diagnosis not present

## 2023-06-06 DIAGNOSIS — R42 Dizziness and giddiness: Secondary | ICD-10-CM | POA: Diagnosis present

## 2023-06-07 ENCOUNTER — Emergency Department (HOSPITAL_COMMUNITY)
Admission: EM | Admit: 2023-06-07 | Discharge: 2023-06-07 | Disposition: A | Discharge status: Home / Self Care | Payer: Medicaid Other | Attending: Emergency Medicine | Admitting: Emergency Medicine

## 2023-06-07 ENCOUNTER — Encounter (HOSPITAL_COMMUNITY): Payer: Self-pay

## 2023-06-07 ENCOUNTER — Other Ambulatory Visit: Payer: Self-pay

## 2023-06-07 DIAGNOSIS — R42 Dizziness and giddiness: Secondary | ICD-10-CM

## 2023-06-07 DIAGNOSIS — E860 Dehydration: Secondary | ICD-10-CM

## 2023-06-07 LAB — URINALYSIS, ROUTINE W REFLEX MICROSCOPIC
Bilirubin Urine: NEGATIVE
Glucose, UA: 50 mg/dL — AB
Hgb urine dipstick: NEGATIVE
Ketones, ur: NEGATIVE mg/dL
Leukocytes,Ua: NEGATIVE
Nitrite: NEGATIVE
Protein, ur: NEGATIVE mg/dL
Specific Gravity, Urine: 1.009 (ref 1.005–1.030)
pH: 7 (ref 5.0–8.0)

## 2023-06-07 LAB — CBC
HCT: 40.7 % (ref 36.0–46.0)
Hemoglobin: 13.5 g/dL (ref 12.0–15.0)
MCH: 30.1 pg (ref 26.0–34.0)
MCHC: 33.2 g/dL (ref 30.0–36.0)
MCV: 90.8 fL (ref 80.0–100.0)
Platelets: 248 10*3/uL (ref 150–400)
RBC: 4.48 MIL/uL (ref 3.87–5.11)
RDW: 11.9 % (ref 11.5–15.5)
WBC: 9 10*3/uL (ref 4.0–10.5)
nRBC: 0 % (ref 0.0–0.2)

## 2023-06-07 LAB — BASIC METABOLIC PANEL
Anion gap: 9 (ref 5–15)
BUN: 8 mg/dL (ref 6–20)
CO2: 23 mmol/L (ref 22–32)
Calcium: 9 mg/dL (ref 8.9–10.3)
Chloride: 106 mmol/L (ref 98–111)
Creatinine, Ser: 0.93 mg/dL (ref 0.44–1.00)
GFR, Estimated: >60 mL/min (ref >60)
Glucose, Bld: 102 mg/dL — ABNORMAL HIGH (ref 70–99)
Potassium: 3.7 mmol/L (ref 3.5–5.1)
Sodium: 138 mmol/L (ref 135–145)

## 2023-06-07 LAB — CBG MONITORING, ED: Glucose-Capillary: 88 mg/dL (ref 70–99)

## 2023-06-07 LAB — PREGNANCY, URINE: Preg Test, Ur: NEGATIVE

## 2023-06-07 MED ORDER — SODIUM CHLORIDE 0.9 % IV BOLUS
1000.0000 mL | Freq: Once | INTRAVENOUS | Status: AC
  Administered 2023-06-07: 1000 mL via INTRAVENOUS

## 2023-06-07 NOTE — ED Provider Notes (Signed)
[place] EMERGENCY DEPARTMENT AT [facility] Provider Note   CSN: [id number] Arrival date & time: 06/06/23  2355     History  Chief Complaint  Patient presents with   Dizziness    Monica Barker is a 42 y.o. female.  Patient presents to the emergency department for evaluation of dizziness.  She reports that she is getting sensation of feeling dizzy and nausea when she stands up.  She has been getting cold sweats as well.  Symptoms ongoing for 3 days.  Patient has a history of Crohn's disease, was just switched to Remicade.  She reports that she did have similar symptoms in the past with Crohn's medications.  She is not experiencing any abdominal pain, rectal bleeding.  No fever.       Home Medications Prior to Admission medications   Medication Sig Start Date End Date Taking? Authorizing Provider  acyclovir (ZOVIRAX) 800 MG tablet Take 800 mg by mouth 3 (three) times daily. 02/17/22   [provider]  ALPRAZolam Prudy Feeler) 0.5 MG tablet Take 0.5 mg by mouth 3 (three) times daily as needed for anxiety.    [provider]  cyanocobalamin (,VITAMIN B-12,) 1000 MCG/ML injection INJECT 1 ML (1,000 MCG TOTAL) INTO THE MUSCLE EVERY 30 (THIRTY) DAYS. 07/10/20   Lynann Bologna, MD  Fe Fum-FA-B Cmp-C-Zn-Mg-Mn-Cu (HEMOCYTE PLUS) 106-1 MG CAPS Take 1 tablet by mouth daily. 07/30/22   Lynann Bologna, MD  Multiple Vitamin (MULTIVITAMIN) capsule Take 1 capsule by mouth daily.    [provider]  nicotine (NICODERM CQ - DOSED IN MG/24 HOURS) 14 mg/24hr patch Place 14 mg onto the skin daily. 04/14/22   [provider]  pantoprazole (PROTONIX) 40 MG tablet Take 1 tablet (40 mg total) by mouth daily. Patient not taking: Reported on 12/02/2022 09/25/21   Lynann Bologna, MD  Risankizumab-rzaa [facility]) 180 MG/1.2ML SOCT Inject 180 mg into the skin every 8 (eight) weeks. 04/07/23   Lynann Bologna, MD  VRAYLAR 1.5 MG capsule Take 1.5 mg by mouth daily. 02/04/22   [provider]  VYVANSE 20 MG capsule Take 20 mg by mouth every morning. 02/03/22   [provider]      Allergies    Ativan [lorazepam], Latex, and Latex    Review of Systems   Review of Systems  Physical Exam Updated Vital Signs BP 108/66 (BP Location: Right Arm)   Pulse 64   Temp 98.4 F (36.9 C) (Oral)   Resp 18   Ht 5\' 2"  (1.575 m)   Wt 68 kg   LMP 05/24/2023 (Approximate)   SpO2 100%   BMI 27.44 kg/m  Physical Exam Vitals and nursing note reviewed.  Constitutional:      General: She is not in acute distress.    Appearance: She is well-developed.  HENT:     Head: Normocephalic and atraumatic.     Mouth/Throat:     Mouth: Mucous membranes are moist.  Eyes:     General: Vision grossly intact. Gaze aligned appropriately.     Extraocular Movements: Extraocular movements intact.     Conjunctiva/sclera: Conjunctivae normal.  Cardiovascular:     Rate and Rhythm: Normal rate and regular rhythm.     Pulses: Normal pulses.     Heart sounds: Normal heart sounds, S1 normal and S2 normal. No murmur heard.    No friction rub. No gallop.  Pulmonary:     Effort: Pulmonary effort is normal. No respiratory distress.     Breath sounds:  Normal breath sounds.  Abdominal:     General: Bowel sounds are normal.     Palpations: Abdomen is soft.     Tenderness: There is no abdominal tenderness. There is no guarding or rebound.     Hernia: No hernia is present.  Musculoskeletal:        General: No swelling.     Cervical back: Full passive range of motion without pain, normal range of motion and neck supple. No spinous process tenderness or muscular tenderness. Normal range of motion.     Right lower leg: No edema.     Left lower leg: No edema.  Skin:    General: Skin is warm and dry.     Capillary Refill: Capillary refill takes less than 2 seconds.     Findings: No ecchymosis, erythema, rash or wound.  Neurological:     General: No focal deficit present.     Mental  Status: She is alert and oriented to person, place, and time.     GCS: GCS eye subscore is 4. GCS verbal subscore is 5. GCS motor subscore is 6.     Cranial Nerves: Cranial nerves 2-12 are intact.     Sensory: Sensation is intact.     Motor: Motor function is intact.     Coordination: Coordination is intact.  Psychiatric:        Attention and Perception: Attention normal.        Mood and Affect: Mood normal.        Speech: Speech normal.        Behavior: Behavior normal.     ED Results / Procedures / Treatments   Labs (all labs ordered are listed, but only abnormal results are displayed) Labs Reviewed  BASIC METABOLIC PANEL - Abnormal; Notable for the following components:      Result Value   Glucose, Bld 102 (*)    All other components within normal limits  URINALYSIS, ROUTINE W REFLEX MICROSCOPIC - Abnormal; Notable for the following components:   Glucose, UA 50 (*)    All other components within normal limits  CBC  PREGNANCY, URINE  CBG MONITORING, ED    EKG EKG Interpretation Date/Time:  Sunday June 07 2023 00:02:53 EDT Ventricular Rate:  72 PR Interval:  136 QRS Duration:  86 QT Interval:  406 QTC Calculation: 444 R Axis:   76  Text Interpretation: Normal sinus rhythm Normal ECG No previous ECGs available Confirmed by Gilda Crease [id number]) on 06/07/2023 5:14:04 AM  Radiology No results found.  Procedures Procedures    Medications Ordered in ED Medications  sodium chloride 0.9 % bolus 1,000 mL (has no administration in time range)    ED Course/ Medical Decision Making/ A&P                                 Medical Decision Making Amount and/or Complexity of Data Reviewed Labs: ordered.   Differential diagnosis considered includes, but not limited to: Vertigo; dehydration; Crohn's exacerbation; medication reaction  Patient presents to the emergency department stating that she has not been feeling well for several days.  She has been having  some intermittent cold sweats and when she stands up she gets dizzy and feels nauseated.  Her urine has been dark yellow.  She recently switched to Remicade, has had 2 doses, last dose was a week ago.  She has had similar reactions to other Crohn's infusions.  She was previously on [name] and did well but it had to be changed due to her insurance.  Patient appears well.  Abdominal exam is benign, nontender.  No abdominal pain, rectal bleeding or signs of Crohn's exacerbation patient's vital signs are unremarkable at rest: Slightly low blood pressures.  Suspect that she is having some orthostatic changes and is dehydrated.  Will hydrate.  Blood work is unremarkable.  No anemia.  Normal kidney function, normal electrolytes.  Urinalysis without signs of infection.  Patient will be discharged after hydration.  Follow-up with Dr. Chales Abrahams, her gastroenterologist.  If this is felt to be secondary to the Remicade, perhaps insurance will pay for [name] if she has had multiple other medication failures.        Final Clinical Impression(s) / ED Diagnoses Final diagnoses:  Dizziness  Dehydration    Rx / DC Orders ED Discharge Orders     None         Chaske Paskett, Canary Brim, MD 06/07/23 [phone]

## 2023-06-07 NOTE — ED Notes (Signed)
Assumed care of pt ambulatory to room with steady giat c/o 3 day hx of dizziness weakness nausea cold sweats and lightheadedness. Pt states she is on new chrohn's medication last dose 1 week ago.Patient a/o x 4 respirations even and non labored vs wnl

## 2023-06-07 NOTE — ED Triage Notes (Signed)
Pt reports dizziness, weakness, nausea, lightheadedness, and has been breaking out into cold sweats that started about 3 days ago, worse when standing. She has hx of crohn's disease and has started a new medication of [employer] infusions; last infusion was last Saturday. She denies any pain. Skin warm and dry at this time.

## 2023-06-08 ENCOUNTER — Telehealth: Payer: Self-pay

## 2023-06-08 NOTE — Telephone Encounter (Signed)
Pt called in stating that she went to the ED recently and they diagnosed her with Dehydration and stating that it could be coming from her infusions. Pt was recommended for her to follow up with her GI. Pt was scheduled to see Alcide Evener NP on 06/10/2023 at 10:00 AM. Pt made aware.  Pt verbalized understanding with all questions answered.

## 2023-06-10 ENCOUNTER — Encounter: Payer: Self-pay | Admitting: Nurse Practitioner

## 2023-06-10 ENCOUNTER — Ambulatory Visit (INDEPENDENT_AMBULATORY_CARE_PROVIDER_SITE_OTHER): Payer: Medicaid Other | Admitting: Nurse Practitioner

## 2023-06-10 VITALS — BP 116/68 | HR 98 | Ht 62.0 in | Wt 150.0 lb

## 2023-06-10 DIAGNOSIS — K5000 Crohn's disease of small intestine without complications: Secondary | ICD-10-CM

## 2023-06-10 NOTE — Patient Instructions (Addendum)
Our office will contact you with Dr. Urban Gibson recommendations regarding your Crohn's treatment.   Due to recent changes in healthcare laws, you may see the results of your imaging and laboratory studies on MyChart before your provider has had a chance to review them.  We understand that in some cases there may be results that are confusing or concerning to you. Not all laboratory results come back in the same time frame and the provider may be waiting for multiple results in order to interpret others.  Please give [place] 48 hours in order for your provider to thoroughly review all the results before contacting the office for clarification of your results.   Thank you for trusting me with your gastrointestinal care!   Alcide Evener, CRNP

## 2023-06-10 NOTE — Progress Notes (Unsigned)
     06/10/2023 Monica Barker [id number] [birth year]   Chief Complaint:  History of Present Illness:   Remicade first infusion 3 weeks ago, 2nd infusion  First infusion for 2 days felt wiped out, fatigued, stayed in bed, had Benadryl and Tylenol pre med. Second infusion did not take Benadryl, did not feel as fatigued like after the first infusion.   She works in a [employer].  Saturday light headed, dizzy, nauseated, weak, went to the ED. IV fluids. Staying hydrated. Still feels weak.  She has BM once or 5 times loose, watery,  Iron supplement, stool is dark, black since taking iron x 2 months.  No bright red blood.   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 Refused any meds for Crohn's d/t S/Es.  Self controlled on CBD oil.  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 Started Humira 09/03/2021 with good relief. Lost response to humira (+Ab/low level). Stopped 04/2022. Neg TB gold and HBsAg. S/P Hep B vaccine. Did not tolerate Entiyo d/t N/dizziness 07/2022.   #2.  Associated IDA and B12 deficiency. Last B12 was normal.    Current Medications, Allergies, Past Medical History, Past Surgical History, Family History and Social History were reviewed in [employer] record.   Review of Systems:   Constitutional: Negative for fever, sweats, chills or weight loss.  Respiratory: Negative for shortness of breath.   Cardiovascular: Negative for chest pain, palpitations and leg swelling.  Gastrointestinal: See HPI.  Musculoskeletal: Negative for back pain or muscle aches.  Neurological: Negative for dizziness,  headaches or paresthesias.    Physical Exam: LMP 05/24/2023 (Approximate)  General: in no acute distress. Head: Normocephalic and atraumatic. Eyes: No scleral icterus. Conjunctiva pink . Ears: Normal auditory acuity. Mouth: Dentition intact. No ulcers or lesions.  Lungs: Clear throughout to auscultation. Heart: Regular rate and rhythm, no murmur. Abdomen: Soft, nontender and nondistended. No masses or hepatomegaly. Normal bowel sounds x 4 quadrants.  Rectal: *** Musculoskeletal: Symmetrical with no gross deformities. Extremities: No edema. Neurological: Alert oriented x 4. No focal deficits.  Psychological: Alert and cooperative. Normal mood and affect  Assessment and Recommendations: ***

## 2023-06-11 ENCOUNTER — Telehealth: Payer: Self-pay

## 2023-06-11 ENCOUNTER — Encounter: Payer: Self-pay | Admitting: Nurse Practitioner

## 2023-06-11 NOTE — Telephone Encounter (Signed)
Spoke with [facility], and patient received first infusion of Infliximab on 05/16/23 & second infusion on 05/30/23.

## 2023-06-14 NOTE — Progress Notes (Signed)
Lets do all -Proceed with INF, give IVF prior -Can try getting skyrizi (was denied by INS) RG

## 2023-06-16 ENCOUNTER — Telehealth: Payer: Self-pay | Admitting: Nurse Practitioner

## 2023-06-16 NOTE — Telephone Encounter (Signed)
Inbound call from [facility] from [facility] specialty pharmacy requesting an update regarding prior auth for [name] medication. Call back number is [phone]. Please advise, thank you.

## 2023-06-17 NOTE — Telephone Encounter (Signed)
Spoke with Victorino Dike with [facility]. Order was faxed over to [facility]  for pt to receive the  NS 500 cc x 1 prior to infusion.  Victorino Dike was notified that Dr. Chales Abrahams did want to proceed with the [facility] approval at this point so if she does not respond or tolerate Infliximab we will switch back to [facility]. Victorino Dike stated that she will come to office on Tuesday to obtain Pt records and prescription. Pt was made aware to contact our office a day after her next infusion for a symptom update.  Pt verbalized understanding with all questions answered.

## 2023-06-17 NOTE — Telephone Encounter (Signed)
Spoke to [facility] with [facility] specialty pharmacy. Amber was requesting where we are in the PA with the Skyrizi Medication. Amber was notified that I would contact [facility] where she had been getting her infusions and see if I can get an update.  Left message for Victorino Dike at [facility] to call back. Direct number provided.

## 2023-06-17 NOTE — Telephone Encounter (Signed)
Monica Barker, I will forward CC message with Dr. Urban Gibson recommendations. Thank you for sending me this update. Per Dr. Chales Abrahams, patient to proceed with next Infliximab infusion as scheduled, patient to received IV fluids prior to start of Infliximab infusion. Please have infusion nurse administer NS 500 cc x 1 prior to infusion.   Also, yes, please have [facility] initiate Skyrizi appeal.  Note, patient previously received Skyrizi infusion but never started the [name] injections because the injections were not covered by her insurance therefore she was started on Infliximab.   Patient stated overall feeling much getter when she was on [name]. It may be too soon to say she has failed Infliximab.   It would be proactive to get Skyrizi approval at this juncture so if she does not respond or tolerate Infliximab we will switch back to [name].   Dr. Chales Abrahams will need to verify, if [name] is restarted, will she need to repeat the Skyrizi infusions vs going directly on injections?   Patient needs to contact our office after her next Infliximab infusion to obtain symptoms update

## 2023-06-17 NOTE — Progress Notes (Signed)
Monica Barker, refer to phone message today and Dr. Urban Gibson addendum to office visit note.   Monica Barker,  Per Dr. Chales Abrahams, patient to proceed with next Infliximab infusion as scheduled, patient to received IV fluids prior to start of Infliximab infusion. Please have infusion nurse administer NS 500 cc x 1 prior to infusion.   Also, yes, please have [facility] initiate Skyrizi appeal.  Note, patient previously received Skyrizi infusion but never started the [name] injections because the injections were not covered by her insurance therefore she was started on Infliximab.   Patient stated overall feeling much getter when she was on [name]. It may be too soon to say she has failed Infliximab.   It would be proactive to get Skyrizi approval at this juncture so if she does not respond or tolerate Infliximab we will switch back to [name].   Dr. Chales Abrahams will need to verify, if [name] is restarted, will she need to repeat the Skyrizi infusions vs going directly on injections?   Patient needs to contact our office after her next Infliximab infusion to obtain symptoms update

## 2023-06-17 NOTE — Telephone Encounter (Signed)
Spoke with Victorino Dike from [facility]. Victorino Dike was notified of the message from [facility] at the speciality pharmacy. Victorino Dike stated that they did not initiate anything through them.  Chart was reviewed. Please advise on plan/ orders for IV fluids prior to next  infusion and if you would like [facility] to initiate a PA for the Skirizi.

## 2023-06-17 NOTE — Telephone Encounter (Signed)
I doubt if [name] would be approved by Medicaid Please do not stop Remicade until we are sure RG

## 2023-06-19 NOTE — Telephone Encounter (Signed)
Inbound call from [facility] specialty pharmacy in regards to patient medication skyrizi. States they do not have a contact number to reach patient to inwuire if she needs medication.

## 2023-06-29 NOTE — Telephone Encounter (Signed)
Please see notes below. Pt was contacted for a symptom update after recent infusion.  Pt stated that she has been extremely  tired and fatigued after the infusion. Pt stated that she slept after the infusion on Saturday and slept for 14 hours last night ( Sunday Night) . Monica Barker stated that she felt better after the initial IV fluids were given but over all she still feels extreme fatigue. Pt requesting if possible could we explore the Skyrizi route once more and see if insurance will cover it and she stated that it worked really good for her.  Please advise

## 2023-06-29 NOTE — Telephone Encounter (Signed)
Lets try our best to get skyrizi approved RG

## 2023-06-29 NOTE — Telephone Encounter (Signed)
Left message for pt to call back  °

## 2023-06-30 NOTE — Telephone Encounter (Signed)
Note Multiple threads on this Note. Spoke with Dr. Chales Abrahams verbally on this Pt.  Pt was notified of Dr. Chales Abrahams recommendations to schedule an office visit. Pt was rescheduled to see Dr. Chales Abrahams on 07/23/2023 at 1:30: Pt made aware. Pt verbalized understanding with all questions answered.    Jennifer from [facility] updated on the status with the pt.  [facility] was contacted and asked that that prescription be discontinued that was placed by pt PCP and that our our office will order it through [facility].

## 2023-06-30 NOTE — Telephone Encounter (Signed)
Inbound call from [place] with [facility] requesting a call to discuss an update on [place]. Please advise.  Phone number  [phone]

## 2023-07-09 ENCOUNTER — Encounter: Payer: Self-pay | Admitting: Gastroenterology

## 2023-07-23 ENCOUNTER — Encounter: Payer: Self-pay | Admitting: Gastroenterology

## 2023-07-23 ENCOUNTER — Ambulatory Visit: Payer: Medicaid Other | Admitting: Gastroenterology

## 2023-07-23 ENCOUNTER — Other Ambulatory Visit: Payer: Self-pay

## 2023-07-23 ENCOUNTER — Ambulatory Visit (INDEPENDENT_AMBULATORY_CARE_PROVIDER_SITE_OTHER): Payer: Medicaid Other

## 2023-07-23 VITALS — BP 114/82 | HR 78 | Ht 62.0 in | Wt 150.0 lb

## 2023-07-23 DIAGNOSIS — K50919 Crohn's disease, unspecified, with unspecified complications: Secondary | ICD-10-CM | POA: Diagnosis not present

## 2023-07-23 DIAGNOSIS — E538 Deficiency of other specified B group vitamins: Secondary | ICD-10-CM

## 2023-07-23 DIAGNOSIS — D509 Iron deficiency anemia, unspecified: Secondary | ICD-10-CM | POA: Diagnosis not present

## 2023-07-23 LAB — COMPREHENSIVE METABOLIC PANEL
ALT: 10 U/L (ref 0–35)
AST: 12 U/L (ref 0–37)
Albumin: 4.3 g/dL (ref 3.5–5.2)
Alkaline Phosphatase: 39 U/L (ref 39–117)
BUN: 6 mg/dL (ref 6–23)
CO2: 28 meq/L (ref 19–32)
Calcium: 9.1 mg/dL (ref 8.4–10.5)
Chloride: 105 meq/L (ref 96–112)
Creatinine, Ser: 0.76 mg/dL (ref 0.40–1.20)
GFR: 98.18 mL/min (ref >60.00)
Glucose, Bld: 90 mg/dL (ref 70–99)
Potassium: 4.5 meq/L (ref 3.5–5.1)
Sodium: 137 meq/L (ref 135–145)
Total Bilirubin: 0.4 mg/dL (ref 0.2–1.2)
Total Protein: 7 g/dL (ref 6.0–8.3)

## 2023-07-23 LAB — CBC WITH DIFFERENTIAL/PLATELET
Basophils Absolute: 0 10*3/uL (ref 0.0–0.1)
Basophils Relative: 0.2 % (ref 0.0–3.0)
Eosinophils Absolute: 0 10*3/uL (ref 0.0–0.7)
Eosinophils Relative: 0.2 % (ref 0.0–5.0)
HCT: 43.3 % (ref 36.0–46.0)
Hemoglobin: 14.2 g/dL (ref 12.0–15.0)
Lymphocytes Relative: 29.2 % (ref 12.0–46.0)
Lymphs Abs: 2.7 10*3/uL (ref 0.7–4.0)
MCHC: 32.9 g/dL (ref 30.0–36.0)
MCV: 89 fL (ref 78.0–100.0)
Monocytes Absolute: 0.4 10*3/uL (ref 0.1–1.0)
Monocytes Relative: 3.8 % (ref 3.0–12.0)
Neutro Abs: 6.1 10*3/uL (ref 1.4–7.7)
Neutrophils Relative %: 66.6 % (ref 43.0–77.0)
Platelets: 269 10*3/uL (ref 150.0–400.0)
RBC: 4.86 Mil/uL (ref 3.87–5.11)
RDW: 12.8 % (ref 11.5–15.5)
WBC: 9.2 10*3/uL (ref 4.0–10.5)

## 2023-07-23 LAB — C-REACTIVE PROTEIN: CRP: <1 mg/dL (ref 0.5–20.0)

## 2023-07-23 NOTE — Patient Instructions (Addendum)
_______________________________________________________  If your blood pressure at your visit was 140/90 or greater, please contact your primary care physician to follow up on this.  _______________________________________________________  If you are age 40 or older, your body mass index should be between 23-30. Your Body mass index is 27.44 kg/m. If this is out of the aforementioned range listed, please consider follow up with your Primary Care Provider.  If you are age 40 or younger, your body mass index should be between 19-25. Your Body mass index is 27.44 kg/m. If this is out of the aformentioned range listed, please consider follow up with your Primary Care Provider.   ________________________________________________________  The [place] [facility] providers would like to encourage you to use [facility] to communicate with providers for non-urgent requests or questions.  Due to long hold times on the telephone, sending your provider a message by [facility] may be a faster and more efficient way to get a response.  Please allow 48 business hours for a response.  Please remember that this is for non-urgent requests.  _______________________________________________________  Continue infusions  A referral has been to IBD please call us in 2 weeks if you haven't heard anything  Please come back 1-2 days prior to your next infusion to get your antibodies checked  Your provider has requested that you go to the basement level for lab work before leaving today. Press "B" on the elevator. The lab is located at the first door on the left as you exit the elevator.  Your provider has ordered "Diatherix" stool testing for you. You have received a kit from our office today containing all necessary supplies to complete this test. Please carefully read the stool collection instructions provided in the kit before opening the accompanying materials. In addition, be sure to place the label from the top left corner of  the laboratory request sheet onto the "puritan opti-swab" tube that is supplied in the kit. This label should include your full name and date of birth. After completing the test, you should secure the purtian tube into the specimen biohazard bag. The laboratory request information sheet (including date and time of specimen collection) should be placed into the outside pocket of the specimen biohazard bag and returned to the [place] lab with 2 days of collection.   Please follow up in 6 months. Give [place] a call at [phone] to schedule an appointment.  Thank you,  Dr. Lynann Bologna

## 2023-07-23 NOTE — Progress Notes (Signed)
Chief Complaint: FU  Referring Provider:  Hortencia Conradi, NP      ASSESSMENT AND PLAN;   #1.  Crohn's disease with anastomotic recurrence.  Dx at age 42 s/p R ileocolectomy (2005), with postoperative recurrance, moderate activity on colon 07/2015. Refused any meds for Crohn's d/t S/Es.  Self controlled with CBD oil. 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 Started Humira 09/03/2021 with good relief. Lost response to humira (+Ab/low level). Stopped 04/2022.  Initiated Entyvio- did not d/t N/dizziness-discontinued 07/2022.  Started on [place] 12/2022, got 3 induction infusions.  However, insurance denied home SQ maintenance. INS denied appeals.  Felt best when she was on [name]. Started Remicade 10 mg/kg July 2024.  Initiation dose completed 06/2023, now on maintenance Q8 weekly.  #2.  Associated IDA and B12 deficiency. Last B12 was normal.    Plan: Continue maintenance Remicade 10mg /kg Q8weekly. CBC, CMP, CRP, TB gold, HBsAg. Stool studies - GI pathogens, calprotectin IBD clinic at [facility] Check INF level/Ab prior to next dose FU in 6 months D/W Kendell Bane (husband) HPI:    Monica Barker is a 42 y.o. female   For FU visit.   Felt best when she was on [name].  Unfortunately insurance denied maintenance therapy.  Per denial letter, need to start infliximab.  Infliximab initiation dose 7/27, second infusion 8/10, third infusion 9/4.  Now on maintenance with next dose scheduled Nov 4  Currently having softer 3-6/day without nocturnal symptoms.  No hematochezia.  She denies having any significant abdominal pain.  She does feel fatigued after infliximab which is somewhat better since we stopped Benadryl with infliximab.  She would like to be referred to IBD clinic at [facility] to see if they could help with [facility] approval process.  Has dermatology appointment for atypical nevi and screening for melanomas.  No fever chills or night sweats.  No weight loss.  Previously CTE -  attempted but she didn't tolerate contrast. Hence, not done.   Wt Readings from Last 3 Encounters:  07/23/23 150 lb (68 kg)  06/10/23 150 lb (68 kg)  06/07/23 150 lb (68 kg)   No NSAIDs     Past GI procedures: Colonoscopy 04/18/2021 - Moderate to severe neoterminal ileitis, consistent with postoperative Crohn's recurrence. Bx- C/W ileitis. Neg random colon biopsies - Diminutive colonic polyp s/p polypectomy. Neg Bx - No colonic or perianal involvement.  EGD 04/18/2021 - Minimal gastritis. No evidence of upper GI Crohn's disease. Bx- neg for HP - SB bx were negative for celiac disease.  CT AP 12/2019 Redemonstrated postoperative findings of terminal ileocolectomy and reanastomosis. There is new inflammatory bowel wall thickening of the most distal remaining 10 cm of ileum at the anastomosis. No evidence of complicating fistula, abscess, or stricture. No evident inflammatory involvement of the colon or rectum.    Past Medical History:  Diagnosis Date   ADHD (attention deficit hyperactivity disorder)    Bipolar disorder (HCC)    Crohn disease (HCC)    Dx 2005 at age 64 s/p R hemicolectomy, with postoperative recurrance, moderate activity on colonoscopy 08/08/2015   Depressive disorder    Dysphagia    Dysthymic disorder    IBS (irritable bowel syndrome)    IBS (irritable bowel syndrome)    with diarrhea   Skin cancer    TMJ (temporomandibular joint disorder)     Past Surgical History:  Procedure Laterality Date   APPENDECTOMY     CHOLECYSTECTOMY  2014   COLON SURGERY  05/2004  due to Crohns   COLON SURGERY  05/2004   COLONOSCOPY  09/02/2017   Recurrance of Crohn's disease in the neo terminal ileum (biopsied)- mild. Minimal rectal erythema (? importance-biopsed). No evidence of peranal Crohn's disease. Status post ileocolectomy.    COLONOSCOPY  09/02/2017   Recurrance of Crohn's Disease in the neo terminal ileum (biopsed)-mild. Minimal rectal erythema (?  Importance-biposed) No Evidence of peranal crohn's disease. Status post ileccolectomy.   DILATION AND CURETTAGE OF UTERUS  11/2021   [facility]   ESOPHAGOGASTRODUODENOSCOPY  10/03/2013   Mild gastritis. Normal EGD   ESOPHAGOGASTRODUODENOSCOPY  10/03/2013   Mild Gastritis. Normal EGD    Family History  Problem Relation Age of Onset   Skin cancer Father    COPD Father    Gout Father    Arthritis Father    Hypertension Father    Endometriosis Sister    Diabetes Brother    Drug abuse Brother    Breast cancer Maternal Grandmother        happened twice/x2   Skin cancer Paternal Grandmother    ADD / ADHD Son    Healthy Son    Colon cancer Paternal Aunt        Paternal mom's sister/PGM's sister   Esophageal cancer Neg Hx    Stomach cancer Neg Hx    Pancreatic cancer Neg Hx     Social History   Tobacco Use   Smoking status: Former   Smokeless tobacco: Never   Tobacco comments:    09/2019  Vaping Use   Vaping status: Every Day   Substances: Nicotine   Devices: occasionally   Substance Use Topics   Alcohol use: Not Currently   Drug use: Not Currently    Current Outpatient Medications  Medication Sig Dispense Refill   acyclovir (ZOVIRAX) 800 MG tablet Take 800 mg by mouth 3 (three) times daily.     ALPRAZolam (XANAX) 0.5 MG tablet Take 0.5 mg by mouth 3 (three) times daily as needed for anxiety.     cyanocobalamin (,VITAMIN B-12,) 1000 MCG/ML injection INJECT 1 ML (1,000 MCG TOTAL) INTO THE MUSCLE EVERY 30 (THIRTY) DAYS. 1 mL 12   Fe Fum-FA-B Cmp-C-Zn-Mg-Mn-Cu (HEMOCYTE PLUS) 106-1 MG CAPS Take 1 tablet by mouth daily. 90 capsule 4   Multiple Vitamin (MULTIVITAMIN) capsule Take 1 capsule by mouth daily.     nicotine (NICODERM CQ - DOSED IN MG/24 HOURS) 14 mg/24hr patch Place 14 mg onto the skin daily.     pantoprazole (PROTONIX) 40 MG tablet Take 1 tablet (40 mg total) by mouth daily. 90 tablet 3   VRAYLAR 1.5 MG capsule Take 1.5 mg by mouth daily.     VYVANSE 20  MG capsule Take 20 mg by mouth every morning.     inFLIXimab (REMICADE) 100 MG injection Inject into the vein.     No current facility-administered medications for this visit.    Allergies  Allergen Reactions   Ativan [Lorazepam]     intesitided her anxiety    Latex    Latex Other (See Comments)    unknown    Review of Systems:  neg     Physical Exam:    BP 114/82   Pulse 78   Ht 5\' 2"  (1.575 m)   Wt 150 lb (68 kg)   SpO2 99%   BMI 27.44 kg/m  Filed Weights   07/23/23 1319  Weight: 150 lb (68 kg)   Gen: awake, alert, NAD HEENT: anicteric, no pallor CV: RRR,  no mrg Pulm: CTA b/l Abd: soft, NT/ND, +BS throughout Ext: no c/c/e Neuro: nonfocal   Data Reviewed: I have personally reviewed following labs and imaging studies  CBC:    Latest Ref Rng & Units 06/07/2023   12:21 AM 03/06/2022    3:33 PM 08/19/2021    2:59 PM  CBC  WBC 4.0 - 10.5 K/uL 9.0  8.4  6.8   Hemoglobin 12.0 - 15.0 g/dL 54.0  98.1  19.1   Hematocrit 36.0 - 46.0 % 40.7  40.5  38.1   Platelets 150 - 400 K/uL 248  310.0  290.0     CMP:    Latest Ref Rng & Units 06/07/2023   12:21 AM 03/06/2022    3:33 PM 08/19/2021    2:59 PM  CMP  Glucose 70 - 99 mg/dL 478  91  80   BUN 6 - 20 mg/dL 8  6  7    Creatinine 0.44 - 1.00 mg/dL 2.95  6.21  3.08   Sodium 135 - 145 mmol/L 138  135  141   Potassium 3.5 - 5.1 mmol/L 3.7  3.9  4.1   Chloride 98 - 111 mmol/L 106  104  107   CO2 22 - 32 mmol/L 23  25  28    Calcium 8.9 - 10.3 mg/dL 9.0  9.0  8.9   Total Protein 6.0 - 8.3 g/dL  7.5  6.7   Total Bilirubin 0.2 - 1.2 mg/dL  0.4  0.3   Alkaline Phos 39 - 117 U/L  54  45   AST 0 - 37 U/L  14  12   ALT 0 - 35 U/L  13  10         Edman Circle, MD 07/23/2023, 1:51 PM  Cc: Hortencia Conradi, NP

## 2023-07-25 LAB — QUANTIFERON-TB GOLD PLUS
Mitogen-NIL: >10 [IU]/mL
NIL: 0.12 [IU]/mL
QuantiFERON-TB Gold Plus: NEGATIVE
TB1-NIL: 0 [IU]/mL
TB2-NIL: <0 [IU]/mL

## 2023-07-25 LAB — HEPATITIS B SURFACE ANTIGEN: Hepatitis B Surface Ag: NONREACTIVE

## 2023-07-29 ENCOUNTER — Telehealth: Payer: Self-pay

## 2023-07-29 NOTE — Telephone Encounter (Signed)
IBD referral sent to [phone] successfully. Phone number [phone] all 22 pages

## 2023-07-30 ENCOUNTER — Telehealth: Payer: Self-pay | Admitting: Gastroenterology

## 2023-07-30 ENCOUNTER — Encounter: Payer: Self-pay | Admitting: Gastroenterology

## 2023-07-30 NOTE — Telephone Encounter (Signed)
Patient cancelled referral to [facility] already. 23 page referral sent [facility] including referral form sent

## 2023-07-30 NOTE — Telephone Encounter (Signed)
Inbound call from patient stating she is returning phone call. States she is unsure what it was regarding. Requesting a call  back. Please advise, thank you.

## 2023-07-30 NOTE — Telephone Encounter (Signed)
I had called regarding her lab work but I sent a Clinical cytogeneticist message about it but I did sent her referral as well too. Did leave a voicemail.

## 2023-07-30 NOTE — Telephone Encounter (Signed)
Mychart message sent.

## 2023-08-20 ENCOUNTER — Other Ambulatory Visit: Payer: Medicaid Other

## 2023-08-20 ENCOUNTER — Other Ambulatory Visit: Payer: Self-pay

## 2023-08-20 DIAGNOSIS — K50919 Crohn's disease, unspecified, with unspecified complications: Secondary | ICD-10-CM

## 2023-08-24 ENCOUNTER — Encounter: Payer: Self-pay | Admitting: Gastroenterology

## 2023-08-24 NOTE — Telephone Encounter (Signed)
Pt stated that she recently had her infusion of the Infliximab on Saturday and has been experiencing fatigue and bleeding when having a BM.  Pt states that she plans to apply for disability.  Pt stated that she has an appointment to see [facility] on 11/19/2023. Please review and advise.

## 2023-08-25 ENCOUNTER — Encounter: Payer: Self-pay | Admitting: Gastroenterology

## 2023-08-31 ENCOUNTER — Telehealth: Payer: Self-pay

## 2023-08-31 NOTE — Telephone Encounter (Signed)
Monica Barker called [phone] from [facility] to report the pt has her last remicade infusion 11/2. When the nurse called to check on her a few days later she reported she was having blood in her stool following the infusion. She is on Remicade. They want to know if she should proceed with her next infusion as scheduled. Please advise.

## 2023-09-02 LAB — INFLIXIMAB+AB (SERIAL MONITOR)
Anti-Infliximab Antibody: <22 ng/mL
Infliximab Drug Level: 20 ug/mL

## 2023-09-14 NOTE — Telephone Encounter (Signed)
Victorino Dike from [facility] is reaching out questioning if the Pt is to proceed with her upcoming infusion. Please see notes below. Please review and advise

## 2023-09-22 NOTE — Telephone Encounter (Signed)
[facility] is calling to find out if PT should carry on with the upcoming infusion. Please advise.

## 2023-09-22 NOTE — Telephone Encounter (Signed)
Left message for pt to call back  °

## 2023-09-22 NOTE — Telephone Encounter (Signed)
Pl proceed with   infliximab infusion  I think previous blood in the stool was due to hemorrhoids RG

## 2023-09-23 NOTE — Telephone Encounter (Signed)
Pt made aware.  Victorino Dike with [facility] made aware.

## 2023-10-29 NOTE — Telephone Encounter (Signed)
 Called patient to follow up from message sent to us  yesterday 1/8. She received her infusion on Saturday 1/4, and yesterday experienced dizziness & chest tightness. Today she is only having the dizziness, denies chest tightness. States this always happens after an infusion. It starts out as fatigue, and then the dizziness & chest tightness happens days after. She's unsure if this could be a side effect of the medication & would like for Dr. Charlanne to be aware. Advised her to drink plenty of fluids in the meantime. Last seen with Dr. Charlanne 07/23/23.

## 2023-10-29 NOTE — Telephone Encounter (Signed)
 Discussed symptoms with Dr. Chales Abrahams via secure chat. Received the following orders for patient: "Needs OTC Zyrtec 10mg  tonight and then 10mg  po at bedtime x 2 more days. Pl tell her to take 500mg  of tylenol today as well".

## 2023-11-30 ENCOUNTER — Telehealth: Payer: Self-pay

## 2023-11-30 NOTE — Telephone Encounter (Signed)
 Received fax from [facility] for pt Infusion care plan re certification: Documents signed by Dr. Venice Gillis and faxed to [phone] Left message for pt to call back

## 2023-12-01 NOTE — Telephone Encounter (Signed)
Pt made aware.  Pt stated that the [facility] Dr's are wanting to switch her to skirizi and they are to be in contact with [facility] for the infusions: Victorino Dike with [facility] made aware.  Pt verbalized understanding with all questions answered.

## 2023-12-02 ENCOUNTER — Telehealth: Payer: Self-pay

## 2023-12-02 NOTE — Telephone Encounter (Signed)
Received notification from [place] from [facility] that pt entyvio is to be infused next week. Victorino Dike stated that they are currently working on the PA for [employer] and that there possibly might  be a delay. Dr. Chales Abrahams made aware and stated that it was OK to wait for the Skyrizi to be infused and not to infuse the Entyvio.  Pt made aware. Pt agreed and stated that she does not want the Entyvio.  Pt verbalized understanding with all questions answered.  Victorino Dike from [facility] made aware.

## 2023-12-03 NOTE — Telephone Encounter (Signed)
*  Correction-patient has been receiving Infliximab infusions & would like to work on getting PA for [name]. MD previously made aware of patient request. Victorino Dike with [facility] has been updated.

## 2023-12-03 NOTE — Telephone Encounter (Signed)
Copy of phone note sent to [facility].

## 2024-03-04 ENCOUNTER — Other Ambulatory Visit: Payer: Self-pay

## 2024-03-07 ENCOUNTER — Other Ambulatory Visit (HOSPITAL_COMMUNITY): Payer: Self-pay

## 2024-03-08 ENCOUNTER — Other Ambulatory Visit (HOSPITAL_COMMUNITY): Payer: Self-pay

## 2024-03-09 ENCOUNTER — Other Ambulatory Visit (HOSPITAL_COMMUNITY): Payer: Self-pay

## 2024-03-09 ENCOUNTER — Other Ambulatory Visit: Payer: Self-pay

## 2024-03-09 MED ORDER — SKYRIZI 360 MG/2.4ML ~~LOC~~ SOCT
SUBCUTANEOUS | 3 refills | Status: AC
Start: 2024-02-26 — End: ?
  Filled 2024-03-15: qty 2.4, 56d supply, fill #0

## 2024-03-10 ENCOUNTER — Other Ambulatory Visit: Payer: Self-pay

## 2024-03-15 ENCOUNTER — Other Ambulatory Visit: Payer: Self-pay

## 2024-03-15 ENCOUNTER — Other Ambulatory Visit (HOSPITAL_COMMUNITY): Payer: Self-pay

## 2024-03-21 ENCOUNTER — Other Ambulatory Visit: Payer: Self-pay

## 2024-03-21 NOTE — Progress Notes (Signed)
 Patient filling with [facility]. Dis-enrolling.
